# Patient Record
Sex: Male | Born: 1980 | Race: White | Hispanic: No | Marital: Married | Smoking: Current every day smoker
Health system: Southern US, Community
[De-identification: ages and names within clinical notes are randomized; demographics above are authoritative.]

## PROBLEM LIST (undated history)

## (undated) DIAGNOSIS — F319 Bipolar disorder, unspecified: Secondary | ICD-10-CM

## (undated) DIAGNOSIS — N200 Calculus of kidney: Secondary | ICD-10-CM

## (undated) HISTORY — PX: SURGERY SCROTAL / TESTICULAR: SUR1316

## (undated) HISTORY — PX: ABDOMINAL SURGERY: SHX537

---

## 2004-02-14 ENCOUNTER — Emergency Department: Payer: Self-pay | Admitting: Emergency Medicine

## 2004-03-19 ENCOUNTER — Emergency Department: Payer: Self-pay | Admitting: Emergency Medicine

## 2005-09-18 ENCOUNTER — Emergency Department: Payer: Self-pay | Admitting: Emergency Medicine

## 2006-11-15 ENCOUNTER — Emergency Department: Payer: Self-pay | Admitting: Emergency Medicine

## 2006-11-19 ENCOUNTER — Emergency Department: Payer: Self-pay | Admitting: Emergency Medicine

## 2008-11-07 ENCOUNTER — Emergency Department: Payer: Self-pay | Admitting: Emergency Medicine

## 2008-11-08 ENCOUNTER — Emergency Department: Payer: Self-pay | Admitting: Emergency Medicine

## 2011-04-06 ENCOUNTER — Emergency Department: Payer: Self-pay | Admitting: Emergency Medicine

## 2011-04-06 LAB — COMPREHENSIVE METABOLIC PANEL
Albumin: 4.9 g/dL (ref 3.4–5.0)
Anion Gap: 18 — ABNORMAL HIGH (ref 7–16)
BUN: 12 mg/dL (ref 7–18)
Bilirubin,Total: 0.9 mg/dL (ref 0.2–1.0)
Chloride: 101 mmol/L (ref 98–107)
Creatinine: 1.41 mg/dL — ABNORMAL HIGH (ref 0.60–1.30)
EGFR (African American): >60
Glucose: 157 mg/dL — ABNORMAL HIGH (ref 65–99)
Potassium: 3.1 mmol/L — ABNORMAL LOW (ref 3.5–5.1)
SGPT (ALT): 25 U/L
Sodium: 139 mmol/L (ref 136–145)
Total Protein: 8.7 g/dL — ABNORMAL HIGH (ref 6.4–8.2)

## 2011-04-06 LAB — DRUG SCREEN, URINE
Barbiturates, Ur Screen: NEGATIVE (ref ?–200)
Benzodiazepine, Ur Scrn: POSITIVE (ref ?–200)
Cannabinoid 50 Ng, Ur ~~LOC~~: POSITIVE (ref ?–50)
Cocaine Metabolite,Ur ~~LOC~~: NEGATIVE (ref ?–300)
Phencyclidine (PCP) Ur S: NEGATIVE (ref ?–25)

## 2011-04-06 LAB — CBC
HGB: 16.3 g/dL (ref 13.0–18.0)
MCV: 90 fL (ref 80–100)
Platelet: 310 10*3/uL (ref 150–440)
RBC: 5.31 10*6/uL (ref 4.40–5.90)
RDW: 13.4 % (ref 11.5–14.5)
WBC: 14.3 10*3/uL — ABNORMAL HIGH (ref 3.8–10.6)

## 2011-04-06 LAB — URINALYSIS, COMPLETE
Bacteria: NONE SEEN
Bilirubin,UR: NEGATIVE
Ph: 5 (ref 4.5–8.0)
RBC,UR: 5 /HPF (ref 0–5)
Specific Gravity: 1.026 (ref 1.003–1.030)
Squamous Epithelial: 1
WBC UR: 5 /HPF (ref 0–5)

## 2011-04-06 LAB — ETHANOL
Ethanol %: <0.003 % (ref 0.000–0.080)
Ethanol: <3 mg/dL

## 2011-04-20 ENCOUNTER — Emergency Department: Payer: Self-pay | Admitting: Emergency Medicine

## 2011-04-23 ENCOUNTER — Emergency Department: Payer: Self-pay | Admitting: *Deleted

## 2011-04-25 LAB — CBC
MCH: 30.4 pg (ref 26.0–34.0)
MCV: 89 fL (ref 80–100)
Platelet: 372 10*3/uL (ref 150–440)
RBC: 5.24 10*6/uL (ref 4.40–5.90)
RDW: 12.9 % (ref 11.5–14.5)
WBC: 7.7 10*3/uL (ref 3.8–10.6)

## 2011-04-25 LAB — DRUG SCREEN, URINE
Amphetamines, Ur Screen: NEGATIVE
Barbiturates, Ur Screen: NEGATIVE
Benzodiazepine, Ur Scrn: NEGATIVE
Cannabinoid 50 Ng, Ur ~~LOC~~: POSITIVE
Cocaine Metabolite,Ur ~~LOC~~: NEGATIVE
MDMA (Ecstasy)Ur Screen: NEGATIVE
Methadone, Ur Screen: NEGATIVE
Opiate, Ur Screen: NEGATIVE
Phencyclidine (PCP) Ur S: NEGATIVE
Tricyclic, Ur Screen: NEGATIVE

## 2011-04-25 LAB — COMPREHENSIVE METABOLIC PANEL
Albumin: 4.5 g/dL (ref 3.4–5.0)
Anion Gap: 11 (ref 7–16)
Bilirubin,Total: 0.3 mg/dL (ref 0.2–1.0)
Calcium, Total: 9.4 mg/dL (ref 8.5–10.1)
Chloride: 106 mmol/L (ref 98–107)
Co2: 25 mmol/L (ref 21–32)
Creatinine: 0.86 mg/dL (ref 0.60–1.30)
EGFR (African American): >60
Potassium: 3.7 mmol/L (ref 3.5–5.1)
SGOT(AST): 24 U/L (ref 15–37)
Sodium: 142 mmol/L (ref 136–145)
Total Protein: 8.1 g/dL (ref 6.4–8.2)

## 2011-04-25 LAB — ETHANOL
Ethanol %: <0.003 % (ref 0.000–0.080)
Ethanol: <3 mg/dL

## 2011-04-25 LAB — CARBAMAZEPINE LEVEL, TOTAL: Carbamazepine: 10.2 ug/mL

## 2011-04-25 LAB — TSH: Thyroid Stimulating Horm: 1.03 u[IU]/mL

## 2011-04-25 LAB — SALICYLATE LEVEL: Salicylates, Serum: 3 mg/dL — ABNORMAL HIGH

## 2011-04-26 ENCOUNTER — Inpatient Hospital Stay: Payer: Self-pay | Admitting: Psychiatry

## 2011-04-27 LAB — LIPID PANEL
Cholesterol: 265 mg/dL — ABNORMAL HIGH (ref 0–200)
Ldl Cholesterol, Calc: 169 mg/dL — ABNORMAL HIGH (ref 0–100)
Triglycerides: 287 mg/dL — ABNORMAL HIGH (ref 0–200)

## 2011-04-29 LAB — COMPREHENSIVE METABOLIC PANEL
Albumin: 3.7 g/dL (ref 3.4–5.0)
Alkaline Phosphatase: 62 U/L (ref 50–136)
Anion Gap: 8 (ref 7–16)
Calcium, Total: 9.2 mg/dL (ref 8.5–10.1)
Chloride: 103 mmol/L (ref 98–107)
EGFR (African American): >60
EGFR (Non-African Amer.): >60
Glucose: 95 mg/dL (ref 65–99)
Potassium: 4.3 mmol/L (ref 3.5–5.1)
SGOT(AST): 21 U/L (ref 15–37)
SGPT (ALT): 35 U/L
Sodium: 142 mmol/L (ref 136–145)

## 2011-12-27 ENCOUNTER — Emergency Department: Payer: Self-pay | Admitting: Emergency Medicine

## 2011-12-27 LAB — COMPREHENSIVE METABOLIC PANEL
Albumin: 4.6 g/dL (ref 3.4–5.0)
Anion Gap: 7 (ref 7–16)
BUN: 11 mg/dL (ref 7–18)
Calcium, Total: 9.6 mg/dL (ref 8.5–10.1)
Chloride: 103 mmol/L (ref 98–107)
EGFR (African American): >60
EGFR (Non-African Amer.): >60
Glucose: 90 mg/dL (ref 65–99)
Potassium: 3.8 mmol/L (ref 3.5–5.1)
SGOT(AST): 18 U/L (ref 15–37)
SGPT (ALT): 26 U/L (ref 12–78)

## 2011-12-27 LAB — CBC
HCT: 48 % (ref 40.0–52.0)
HGB: 17 g/dL (ref 13.0–18.0)
MCH: 32.3 pg (ref 26.0–34.0)
MCV: 91 fL (ref 80–100)
RBC: 5.27 10*6/uL (ref 4.40–5.90)

## 2012-03-12 LAB — COMPREHENSIVE METABOLIC PANEL
Albumin: 4.6 g/dL (ref 3.4–5.0)
BUN: 11 mg/dL (ref 7–18)
Bilirubin,Total: 0.4 mg/dL (ref 0.2–1.0)
Chloride: 105 mmol/L (ref 98–107)
EGFR (African American): >60
EGFR (Non-African Amer.): >60
Glucose: 95 mg/dL (ref 65–99)
Osmolality: 275 (ref 275–301)
SGOT(AST): 20 U/L (ref 15–37)
SGPT (ALT): 23 U/L (ref 12–78)

## 2012-03-12 LAB — CBC
HCT: 47.9 % (ref 40.0–52.0)
HGB: 15.9 g/dL (ref 13.0–18.0)
MCHC: 33.1 g/dL (ref 32.0–36.0)
MCV: 88 fL (ref 80–100)
Platelet: 374 10*3/uL (ref 150–440)
RBC: 5.46 10*6/uL (ref 4.40–5.90)
WBC: 12.1 10*3/uL — ABNORMAL HIGH (ref 3.8–10.6)

## 2012-03-12 LAB — URINALYSIS, COMPLETE
Bacteria: NONE SEEN
Blood: NEGATIVE
Hyaline Cast: 12
Leukocyte Esterase: NEGATIVE
Nitrite: NEGATIVE
Ph: 7 (ref 4.5–8.0)
Protein: 30
Specific Gravity: 1.024 (ref 1.003–1.030)
WBC UR: 4 /HPF (ref 0–5)

## 2012-03-12 LAB — DRUG SCREEN, URINE
Amphetamines, Ur Screen: NEGATIVE (ref ?–1000)
Barbiturates, Ur Screen: NEGATIVE (ref ?–200)
Benzodiazepine, Ur Scrn: POSITIVE (ref ?–200)
Cannabinoid 50 Ng, Ur ~~LOC~~: POSITIVE (ref ?–50)
MDMA (Ecstasy)Ur Screen: NEGATIVE (ref ?–500)
Methadone, Ur Screen: NEGATIVE (ref ?–300)
Opiate, Ur Screen: NEGATIVE (ref ?–300)
Phencyclidine (PCP) Ur S: NEGATIVE (ref ?–25)

## 2012-03-12 LAB — ACETAMINOPHEN LEVEL: Acetaminophen: <2 ug/mL

## 2012-03-12 LAB — SALICYLATE LEVEL: Salicylates, Serum: 4.1 mg/dL — ABNORMAL HIGH

## 2012-03-12 LAB — ETHANOL
Ethanol %: <0.003 % (ref 0.000–0.080)
Ethanol: <3 mg/dL

## 2012-03-13 ENCOUNTER — Inpatient Hospital Stay: Payer: Self-pay | Admitting: Psychiatry

## 2012-10-02 ENCOUNTER — Emergency Department: Payer: Self-pay | Admitting: Emergency Medicine

## 2014-07-20 NOTE — Consult Note (Signed)
Psychological Assessment  Jaime Shannon Evaluation: 12-13-13Administered: [facility] Multiphasic Personality Inventory-2 (MMPI-2) for Referral: Jaime Shannon was referred for a psychological assessment by his physician, Jaime LineaJolanta Pucilowska, MD.  He was admitted to Behavioral Medicine for the treatment of suicidal thoughts and increased anxiety. He has a history of a bipolar disorder diagnosis.  Please see the history and physical and psychosocial history for further background information. An assessment of personality structure was requested. Jaime Shannon?s MMPI-2 protocol is compared to that of other adult males he obtained the following profile: 6!!7!**812*34"90*+5-/:#. The MMPI-2 validity scales indicate that the clinical profile is probably not valid. His validity scale elevations suggest the possibility of conscious distortion and exaggeration of symptoms for secondary gain or panicked over-endorsement possibly to draw attention to current fear, panic and distress. Impression:of Bipolar DisorderPersonality Disorder                            Electronic Signatures: Carola Frostoush, Lee Ann (PsyD, HSP-P)  (Signed on 18-Dec-13 11:50)  Authored  Last Updated: 18-Dec-13 11:50 by Carola Frostoush, Lee Ann (PsyD, HSP-P)

## 2014-07-20 NOTE — Consult Note (Signed)
PATIENT NAME:  Jaime Shannon, Aviyon P MR#:  [id number] DATE OF BIRTH:  11/30/1980  DATE OF CONSULTATION:  03/13/2012  REFERRING PHYSICIAN:   CONSULTING PHYSICIAN:  Zahra Peffley K. Darleene Cumpian, MD  SUBJECTIVE: Patient is a 34 year old single white male who is employed as a Investment banker, operationalchef in [place]. Patient is single and lives with his parents who are in their 8050s. Patient has a long history of bipolar disorder and comes back to the hospital stating "I am anxious. I need help. I am not doing well."  HISTORY OF PRESENT ILLNESS: According to information obtained from the chart patient has been off of his medications for five months and reported has been having nausea and vomiting every morning and has been sick for weeks and he has rocking movements and had been back and forth and crying and talking about suicide and suicidal thoughts.   PAST PSYCHIATRIC HISTORY: Patient has a long history of bipolar disorder and he had been on following medications: Depakote 500 mg three times a day, hydroxyzine 25 mg daily, trazodone 15 mg at bedtime. Patient reports that he has been noncompliant with medications for five months. Did have suicidal thoughts in the past but not now as they are under control. Did have inpatient hospitalization in psychiatry in the past.   ALCOHOL AND DRUGS: Denies drinking alcohol. Does admit smoking THC at the rate of 3 grams per day for many years, probably five years, and last smoked it three days ago. When asked how he got the money he reported "I make good money".   MENTAL STATUS EXAM: Patient is alert and oriented to place, person, time. Affect is appropriate with his mood which is low and down and depressed. Admits to crying spells and suicidal thoughts as the cause of feeling low and down but contracts for safety now. Denies any homicidal ideas. Admits feeling paranoid and suspicious and states that for a long time. Denies hearing voices, seeing things. Memory is intact. Cognition intact.  Denies any ideas or plans and said "not now". Does admit feeling anxious and upset and fed up of life. Insight and judgement guarded.    IMPRESSION:  1. Bipolar disorder, depressed. 2. THC dependence, chronic, continuous.   RECOMMENDATION: Patient is started back on above named medications on which he has been stabilized in the past. Inpatient hospitalization in psychiatry when bed is available.    ____________________________ Jannet MantisSurya K. Guss Bundehalla, MD skc:cms D: 03/13/2012 09:08:58 ET T: 03/13/2012 09:19:41 ET JOB#: [id number]  cc: Monika SalkSurya K. Guss Bundehalla, MD, <Dictator>  Beau FannySURYA K Cooper Moroney MD ELECTRONICALLY SIGNED 03/15/2012 17:45

## 2014-07-25 NOTE — Consult Note (Signed)
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   Patient was seen by consultant.   Recommend further assessment or treatment.   Orders entered.   Discussed with Attending MD.   Comments: Mr. Jaime Shannon is a 34 y/o Caucasian male with a prior diagnosis of Bipolar Disorder and Cannabis Dependence who came to the ER voluntarily endorsing both suicidal thoughts and homicidal thoughts in the context of having had recent altercation with his stepfather. The patient also reports that he has been clean from the marijuana which he used daily for years for the past 23 days. He was started on Tegretol by Dr. Luther HearingMisckevic 5 days ago and Tegretol level is 10.2, therapeutic, but he is still having problems with anxiety and racing thoughts. He does endorse generalized homicial thoughts and feels like he could hurt "anyone", no specicific plan. He has been having suicidal thoughts for the past 2-3 days, no plan. Will plan to admit to Inpatient Pscychiatry for medication management, safety,  and stabilization. Patient has an immediate family member [facility] so will refer to another community hospital for admission. Will go ahead and start Depakote at 500mg  po BID for mood stabilization and will give 1000mg  po loading dose of Depakote now.  Electronic Signatures: Caryn SectionKapur, Aarti (MD)  (Signed 23-Jan-13 19:13)  Authored: Brief Consult Note   Last Updated: 23-Jan-13 19:13 by Caryn SectionKapur, Aarti (MD)

## 2014-07-25 NOTE — Consult Note (Signed)
PATIENT NAME:  Jaime Shannon, Atley MR#:  [id number] DATE OF BIRTH:  [birth year]  DATE OF CONSULTATION:  04/25/2011  REFERRING PHYSICIAN:  Rockne MenghiniAnne-Caroline Norman, M.D.  CONSULTING PHYSICIAN:  Doralee AlbinoAarti K. Maryruth BunKapur, MD  REASON FOR CONSULTATION: Suicidal and homicidal thoughts.   IDENTIFYING INFORMATION: [name] is a 34 year old divorced Caucasian male currently living in the [place] area with his mother and stepfather as well as his fiancee and 3662-month-old daughter. 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   HISTORY OF PRESENT ILLNESS: [name] is a 34 year old divorced Caucasian male who came voluntarily on his own to the Emergency Room because he "felt like he was going crazy". The patient says that he was feeling like he was going to hurt someone and was extremely on edge and irritable. He says he has been free from marijuana for the past 23 days but prior to that was smoking high-grade marijuana $100 a day for over 15 years. He was endorsing suicidal thoughts for the past 2 to 3 days but denies any specific plan. The patient was also endorsing generalized homicidal ideation. He said if he would hurt anyone he would look up sex offenders or rapist on the internet and try to find them because he felt like he wanted to hurt somebody who deserved to be hurt. He denied any specific plan. The patient was extremely anxious during the interview, pacing in the room and was restless, had a difficult time sitting still. He does endorse a number of recent stressors including conflict with his stepfather prior to coming into the hospital. The patient has been living with his mom and stepfather for the past two years. He denies any problems in the relationship with his fiancee, however. The patient does admit to mixed mood symptoms including problems with irritability, anger outbursts, increased energy with increased goal-directed behavior when he is feeling  somewhat manic. He denies any grandiose delusions, hyperreligious thoughts or hypersexual behavior. He does admit to problems with extreme anxiety and mild paranoid thoughts "his whole life". He said sometimes he fears going back to prison. He has an extensive legal history and has been incarcerated multiple times in the past for armed robbery, safe cracking and cannabis use. He currently endorses feelings of hopelessness and helplessness, decreased focus concentration and insomnia. He denies any change in appetite, weight gain or weight loss. He denies any anhedonia or change in energy level. The patient is willing to seek hospitalization voluntarily. He was started on Tegretol five days ago by Dr. Alver FisherMickiewicz but says the medication makes him feel somewhat sleepy but has not helped with his mood.   PAST PSYCHIATRIC HISTORY: Patient denies any prior inpatient psychiatric hospitalizations. He does have a history of cutting his wrists when in jail in the past. Most recently he was seen by Dr. Alver FisherMickiewicz five days ago and then did an intake with Michaelyn [facility]Steve Sacko at [facility] earlier this morning. He has had a trial of Paxil in the past but says it "made him go crazy". He was started on Tegretol 400 mg at bedtime five days ago. He denies any other psychotropic medication trials in the past.   SUBSTANCE ABUSE HISTORY: The patient reports using marijuana $100 a day for the past 15 years but has been clean for the past 23 days. He tried cocaine and also tried snorting heroin once in prison but denies any regular use of cocaine or opioids. He says he rarely drinks  alcohol, about once a month. He denies any prescription narcotic abuse but has abused benzodiazepines in the past approximately 10 years ago. He does smoke 1-1/2 packs of cigarettes per day currently.   FAMILY PSYCHIATRIC HISTORY: The patient says that his biological father is an alcoholic and drug addict. He says his stepsister with whom he grew up with and  currently lives with is hospitalized on the psychiatry service currently after an overdose.   PAST MEDICAL HISTORY:  1. History of hypertension. 2. History of surgery for testicular torsion. 3. History of broken jaw.  4. He denies any history of any prior TBI or seizures.   OUTPATIENT MEDICATIONS:  1. Tegretol 400 mg p.o. nightly. 2. Ativan 0.5 mg p.o. daily p.r.n. which he just got from the Emergency Room yesterday.   ALLERGIES: No known drug allergies.   SOCIAL HISTORY: Patient was born and raised in the [place] area by both parents until they divorced at the age of 78. He was then raised by his mother and stepfather. He has two brothers from his mother's side and three step siblings. He denies any history of any physical or sexual abuse. Patient has not seen his biological father in 21 years. He has an eighth-grade education. He was expelled for marijuana use in the eighth grade but then later went back and got his GED. He works currently as a Biomedical scientist in [employer] and drives there on a daily basis. He has been commuting there for the past eight months. The patient has worked in restaurants in the past primarily. He currently lives with his mother and stepfather as well as his fiancee and 27-month-old daughter as well as multiple step siblings and their children in the [place] area.   LEGAL HISTORY: Patient has an extensive legal history having been incarcerated multiple times, each time for about 8 to 12 months for armed robbery, safe cracking, marijuana use.   MENTAL STATUS EXAM: [name] is a 34 year old Caucasian male who was somewhat restless and mildly agitated. He was pacing in his room, had a difficult time sitting still. He was fully alert and oriented to time, place, and situation. Had reddish color hair and a slight beard. Speech was regular rate and rhythm, fluent and coherent. Mood was described as being depressed, however, affect was anxious and restless. Thought processes  were somewhat tangential and patient had to be redirected at times. He did endorse suicidal thoughts but denied any specific plan. He did report homicidal thoughts, no specific plan and said he planned to target people that he found on line that were rapists or sex offenders. He denied any current auditory or visual hallucinations. He did admit to some paranoid thoughts feeling like people had been watching him but said that these thoughts were present his whole life. No overt delusions. Attention and concentration were fairly good. Recall was three out of three initially and three out of three after five minutes. He named the prior presidents as Licensed conveyancer. He had a difficult time doing serial sevens but could do simple calculations. He spelled world backwards as "dlow". Abstraction was good.   SUICIDE RISK ASSESSMENT: At this time Mr. Mcbane remains at a moderately elevated risk of harm to self and others secondary to suicidal and homicidal thoughts. He denies having any access to guns. He is willing to participate in treatment, however, and willing to come into the hospital voluntarily.   REVIEW OF SYSTEMS: CONSTITUTIONAL: He denies any weakness, fatigue or weight changes.  He denies any fever, chills, or night sweats. HEENT: He does report headache currently. He denies any change in vision or dizziness. ENT: He denies any hearing loss. RESPIRATORY: He denies any shortness of breath or cough. CARDIOVASCULAR: He denies any chest pain, orthopnea. GASTROINTESTINAL: He denies any nausea, vomiting, or abdominal pain. He denies any change in bowel movements. GENITOURINARY: He denies incontinence or problems with frequency of urine. ENDOCRINE: He denies any heat or cold intolerance. LYMPHATIC: He denies any anemia or easy bruising. MUSCULOSKELETAL: He denies any current muscle or joint pain. NEUROLOGICAL: He does report a headache. No dizziness. He denies any tingling or weakness. No difficulty with his gait.  PSYCHIATRIC: Please see history of present illness.   PHYSICAL EXAMINATION:  VITAL SIGNS: Blood pressure 126/71, pulse 74, respirations 18, temperature 96.2, pulse ox 96% on room air.   LABORATORY, DIAGNOSTIC AND RADIOLOGICAL DATA: Toxicology screen was positive for cannabis but negative for all other substances. CBC, TSH, LFTs and BMP are within normal limits. Glucose 121. Tegretol level 10.2. Acetaminophen less than 2. Salicylates elevated 3. EKG showed a ventricular rate of 103 with a QTc of 424 on 01/18.   DIAGNOSES:  AXIS I:  1. Bipolar disorder type II.  2. Cannabis dependence. 3. History of opiate and cocaine use.  4. Nicotine dependence.   AXIS II: Rule out personality disorder.   AXIS III:  1. Hypertension.  2. History of testicular torsion.  3. History of broken jaw.   AXIS IV: Moderate-Family conflict, comorbid substance use, noncompliance with treatment in the past, history of legal problems.   AXIS V: GAF at present equals 25.   ASSESSMENT AND TREATMENT RECOMMENDATIONS: Mr. Kasparek is a 34 year old divorced Caucasian male with recent diagnosis of bipolar disorder who presented to the Emergency Room with both suicidal and homicidal thoughts. He does appear to be extremely anxious with some mild hypomanic symptoms. He was not able to contract for safety outside of the hospital. Currently he denies any psychotic symptoms. Will admit to inpatient psychiatry for medication management, safety, and stabilization. 1. Bipolar disorder type II. Will plan to start the patient on Depakote 500 mg p.o. daily and 750 mg p.o. nightly as he has not any significant improvement on Tegretol despite Tegretol level being therapeutic in the Emergency Room. Will hold off on starting an antidepressant at this time secondary to mixed manic symptoms. EKG does not show any QTc prolongation. Will check B12 and folate level as well as lipid panel in a.m. Will place on suicide precautions and one-to-one  observation while in the Emergency Room.  2. Disposition. Recommend Mr. Traweek be admitted to inpatient psychiatry for medication management, safety, and stabilization. At this time he cannot be admitted to the [facility] inpatient psychiat[facility] service as he has an immediate family member as stated above who is currently being hospitalized on the unit. If that patient is discharged patient may be able to be admitted to [facility] inpatient psychiatry. Otherwise will hold in the Emergency Room while seeing a bed at a community hospital. The patient verbalized understanding to plan and was in agreement. He has agreed to stay in the Emergency Room voluntarily and would like a voluntary admission rather than being committed, however, if patient does express a desire to leave he should be placed under an involuntary commitment.  ____________________________ Doralee Albino. Maryruth Bun, MD akk:cms D: 04/26/2011 07:47:53 ET T: 04/26/2011 08:15:38 ET JOB#: [id number]  cc: Aarti K. Maryruth Bun, MD, <Dictator> AARTI K  Maryruth Bun MD ELECTRONICALLY SIGNED 04/26/2011 18:22

## 2014-08-10 NOTE — H&P (Signed)
PATIENT NAMMarland Shannon:  Rocky LinkJERNIGAN, Nizar P [id number] OF BIRTH:  November 03, 1980 OF ADMISSION:  03/13/2012 PHYSICIAN:  Daryel NovemberJonathan Williams, MDPHYSICIAN:  Jannet MantisSurya K. Guss Bundehalla, MD ATTENDING PHYSICIAN: Kristine LineaJolanta Reyann Troop, MD DATA: The patient is a 34 year old male with a history of anxiety, mood instability and substance abuse, COMPLAINT: "I am anxious. I need help. I am not doing well." OF PRESENT ILLNESS: The patient has been off of his medications for five months. He has been increasingly depressed, emotionaly unstable, irritable and angry. He recently developed suicidal ideation. He suffers severe anxiety with daily panic attacks and vomiting.   He reports poor sleep, decreased appetite with weight loss, crying, feeling of guilt, worthlessness, helplessness, low energy, poor memory and concentration and social isolation. He has been smoking cannabis daily. He denies alcohol or other drug use. He has been taking Valium of the streets for incoming panic attacks. He denies psychotic symptoms or symptoms suggestive of bipolar mania.  PSYCHIATRIC HISTORY: Patient has a long history of bipolar disorder. He was hospitalized ar [facility] in January 2013 for similar symptoms. There were no suicide attampts. He has not been compliant with the following medications: Depakote 500 mg three times a day, hydroxyzine 25 mg daily, trazodone 150 mg at bedtime. He was tried on Tegretol and Paxil in the past. PSYCHIATRIC HISTORY:  His father is an alcoholic and drug addict. MEDICAL HISTORY: Hypertension.  Phenergan. ON ADMISSION: None.  HISTORY: He lives with his mother and father and [place]. He works as a Biomedical scientistsous chef. He has a 34-year-old daughter but has not been living with his girlfriend due to fears that the child would be taken away by CPS as the patient is drug abuser. He was incarcerated multiple times for robbery. No current legal problems.  He denies drinking alcohol. He smokes at the rate of 3 grams per day for 15  years.  OF SYSTEMS: CONSTITUTIONAL: No fevers or chills. Positive for weight loss. EYES: No double or blurred vision. ENT: No hearing loss. RESPIRATORY: No shortness of breath or cough. CARDIOVASCULAR: No chest pain or orthopnea. GASTROINTESTINAL: No abdominal pain. Positive for daily vomiting. GU: No incontinence or frequency. ENDOCRINE: No heat or cold intolerance. LYMPHATIC: No anemia or easy bruising. INTEGUMENTARY: No acne or rash. MUSCULOSKELETAL: Positive for muscle and joint pain. NEUROLOGIC: No tingling or weakness. PSYCHIATRIC: See history of present illness for details.  EXAMINATION: VITAL SIGNS: Blood pressure 148/99, pulse 104, respirations 18, temperature 97.2. GENERAL: This is a well developed male in no acute distress. HEENT: The pupils are equal, round, and reactive to light. Sclerae are anicteric. NECK: Supple. No thyromegaly. LUNGS: Clear to auscultation. No dullness to percussion. HEART: Regular rhythm and rate. No murmurs, rubs, or gallops. ABDOMEN: Soft, nontender, nondistended. Positive bowel sounds. MUSCULOSKELETAL: Normal muscle strength in all extremities. SKIN: No rashes or bruises. LYMPHATIC: No cervical adenopathy. NEUROLOGIC: Cranial nerves II through XII are intact.  DATA: Chemistries are within normal limits. Blood alcohol level is zero. LFTs are within normal limits. TSH 0.75. Urine tox screen positive for benzodiazepines and MJ. CBC within normal limits except WBC 12.1. Urinalysis is not suggestive of urinary tract infection. Serum acetaminophen and salicylates are low.   STATUS EXAMINATION ON ADMISSION: The patient is alert and oriented to person, place, time, and situation. He is slightly irritable. He is wearing hospital scrubs. He maintains good eye contact. His speech is of normal rhythm, rate, and volume. Mood is depressed with flat affect. Thought processing is logical and goal oriented. Thought  content: he denies suicidal ideation but was admitted for suicidal thoughts.  There are no delusions or paranoia. There are no auditory or visual hallucinations. His cognition is grossly intact. He registers three out of three and recalls three out of three objects after five minutes. He can spell world forward and backward. He knows the current president. His insight and judgment are questionable.    SUICIDE RISK ASSESSMENT ON ADMISSION: This is a patient with a lifelong history of substance abuse, anxiety and mood instability who came to the ER suicidal.   DIAGNOSES: I:  Mood disorder, not otherwise specified.  Anxiety disorder, not otherwise specified.  AXIS II:  Deferred. III:  Deferred.  IV:  Mental illness, subsatnce abuse, treatment compliance, relationship, family conflict.  V:  GAF on admission 25.  The patient was [facility]al Medicine Unit for safety, stabilization and medication management. She was initially placed on suicide precautions and was closely monitored for any unsafe behaviors. She underwent full psychiatric and risk assessment. She received pharmacotherapy, individual and group psychotherapy, substance abuse counseling, and support from therapeutic milieu.  Suicidal ideation: The patient is able to contarct for safety.  Mood: We will start on Tegretol for mood stabilization and Remeron for depression.   Substance abuse: He minimizes problems and declines treatment.   Disposition: TBE.    Electronic Signatures: Kristine LineaPucilowska, Tonya Carlile (MD)  (Signed on 20-Dec-13 07:00)  Authored  Last Updated: 20-Dec-13 07:00 by Kristine LineaPucilowska, Erika Hussar (MD)

## 2014-08-10 NOTE — H&P (Signed)
PATIENT NAME:  Jaime Shannon, Winner [id number] OF BIRTH:  27-Jun-1980 OF AMDISSION:  04/26/2011 PHYSICIAN:  Rockne MenghiniAnne-Caroline Norman, M.D. PHYSICIAN:  Aarti K. Maryruth BunKapur, MD  REASON FOR ADMITTING: Suicidal and homicidal thoughts.  INFORMATION: [name] is a 34 year old divorced Caucasian male currently living in the [place] area with his mother and stepfather as well as his fiancee and 2548-month-old daughter. 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  OF PRESENT ILLNESS: [name] is a 34 year old divorced Caucasian male who came voluntarily on his own to the Emergency Room because he "felt like he was going crazy". The patient says that he was feeling like he was going to hurt someone and was extremely on edge and irritable. He says he has been free from marijuana for the past 23 days but prior to that was smoking high-grade marijuana $100 a day for over 15 years. He was endorsing suicidal thoughts for the past 2 to 3 days but denies any specific plan. The patient was also endorsing generalized homicidal ideation. He said if he would hurt anyone he would look up sex offenders or rapist on the internet and try to find them because he felt like he wanted to hurt somebody who deserved to be hurt. He denied any specific plan. The patient was extremely anxious during the interview, pacing in the room and was restless, had a difficult time sitting still. He does endorse a number of recent stressors including conflict with his stepfather prior to coming into the hospital. The patient has been living with his mom and stepfather for the past two years. He denies any problems in the relationship with his fiancee, however. The patient does admit to mixed mood symptoms including problems with irritability, anger outbursts, increased energy with increased goal-directed behavior when he is feeling somewhat manic. He denies any grandiose delusions, hyperreligious thoughts or  hypersexual behavior. He does admit to problems with extreme anxiety and mild paranoid thoughts "his whole life". He said sometimes he fears going back to prison. He has an extensive legal history and has been incarcerated multiple times in the past for armed robbery, safe cracking and cannabis use. He currently endorses feelings of hopelessness and helplessness, decreased focus concentration and insomnia. He denies any change in appetite, weight gain or weight loss. He denies any anhedonia or change in energy level. The patient is willing to seek hospitalization voluntarily. He was started on Tegretol five days ago by Dr. Alver FisherMickiewicz but says the medication makes him feel somewhat sleepy but has not helped with his mood.  PSYCHIATRIC HISTORY: Patient denies any prior inpatient psychiatric hospitalizations. He does have a history of cutting his wrists when in jail in the past. Most recently he was seen by Dr. Alver FisherMickiewicz five days ago and then did an intake with Michaelyn BarterSteve Sacko at [facility] earlier this morning. He has had a trial of Paxil in the past but says it "made him go crazy". He was started on Tegretol 400 mg at bedtime five days ago. He denies any other psychotropic medication trials in the past.  ABUSE HISTORY: The patient reports using marijuana $100 a day for the past 15 years but has been clean for the past 23 days. He tried cocaine and also tried snorting heroin once in prison but denies any regular use of cocaine or opioids. He says he rarely drinks alcohol, about once a month. He denies any prescription narcotic abuse but has abused benzodiazepines in the past  approximately 10 years ago. He does smoke 1-1/2 packs of cigarettes per day currently.  PSYCHIATRIC HISTORY: The patient says that his biological father is an alcoholic and drug addict. He says his stepsister with whom he grew up with and currently lives with is hospitalized on the psychiatry service currently after an overdose.  MEDICAL HISTORY:   1. History of hypertension. 2. History of surgery for testicular torsion. History of broken jaw.  He denies any history of any prior TBI or seizures.  MEDICATIONS:  1. Tegretol 400 mg p.o. nightly. 2. Ativan 0.5 mg p.o. daily p.r.n. which he just got from the Emergency Room yesterday.   ALLERGIES: No known drug allergies.  HISTORY: Patient was born and raised in the [place] area by both parents until they divorced at the age of 89. He was then raised by his mother and stepfather. He has two brothers from his mother?s side and three step siblings. He denies any history of any physical or sexual abuse. Patient has not seen his biological father in 21 years. He has an eighth-grade education. He was expelled for marijuana use in the eighth grade but then later went back and got his GED. 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 He has been commuting there for the past eight months. The patient has worked in restaurants in the past primarily. He currently lives with his mother and stepfather as well as his fiancee and 853-month-old daughter as well as multiple step siblings and their children in the [place] area.  HISTORY: Patient has an extensive legal history having been incarcerated multiple times, each time for about 8 to 12 months for armed robbery, safe cracking, marijuana use.  STATUS EXAM: [name] is a 34 year old Caucasian male who was somewhat restless and mildly agitated. He was pacing in his room, had a difficult time sitting still. He was fully alert and oriented to time, place, and situation. Had reddish color hair and a slight beard. Speech was regular rate and rhythm, fluent and coherent. Mood was described as being depressed, however, affect was anxious and restless. Thought processes were somewhat tangential and patient had to be redirected at times. He did endorse suicidal thoughts but denied any specific plan. He did report homicidal thoughts, no  specific plan and said he planned to target people that he found on line that were rapists or sex offenders. He denied any current auditory or visual hallucinations. He did admit to some paranoid thoughts feeling like people had been watching him but said that these thoughts were present his whole life. No overt delusions. Attention and concentration were fairly good. Recall was three out of three initially and three out of three after five minutes. He named the prior presidents as Licensed conveyancerbama and Bush. He had a difficult time doing serial sevens but could do simple calculations. He spelled world backwards as "dlow". Abstraction was good.  RISK ASSESSMENT: At this time [name] remains at a moderately elevated risk of harm to self and others secondary to suicidal and homicidal thoughts. He denies having any access to guns. He is willing to participate in treatment, however, and willing to come into the hospital voluntarily.  OF SYSTEMS: CONSTITUTIONAL: He denies any weakness, fatigue or weight changes. He denies any fever, chills, or night sweats. HEENT: He does report headache currently. He denies any change in vision or dizziness. ENT: He denies any hearing loss. RESPIRATORY: He denies any shortness of breath or  cough. CARDIOVASCULAR: He denies any chest pain, orthopnea. GASTROINTESTINAL: He denies any nausea, vomiting, or abdominal pain. He denies any change in bowel movements. GENITOURINARY: He denies incontinence or problems with frequency of urine. ENDOCRINE: He denies any heat or cold intolerance. LYMPHATIC: He denies any anemia or easy bruising. MUSCULOSKELETAL: He denies any current muscle or joint pain. NEUROLOGICAL: He does report a headache. No dizziness. He denies any tingling or weakness. No difficulty with his gait. PSYCHIATRIC: Please see history of present illness.  EXAMINATION: SIGNS: Blood pressure 127/76, pulse 78, respirations 18, temperature 96.8, pulse ox 96% on room air.  Normocephalic,  atraumatic. Pupils equal, round, and reactive to light and accommodation. Extraocular movements intact. Oral mucosa moist.  Supple. No cervical lymphadenopathy or thyromegaly present.  Clear to auscultation bilaterally. No crackles, rales, or rhonchi.  S1, S2, present. Regular rate and rhythm. No murmurs, rubs, or gallops.  Soft. Normoactive bowel sounds present in all four quadrants. No tenderness noted, no masses noted  +2 pedal pulses bilaterally. No rashes or cyanosis.  Cranial nerves 2 through 12 are grossly intact. DIAGNOSTIC AND RADIOLOGICAL DATA: Toxicology screen was positive for cannabis but negative for all other substances. CBC, TSH, LFTs and BMP are within normal limits. Glucose 121. Tegretol level 10.2. Acetaminophen less than 2. Salicylates elevated 3. EKG showed a ventricular rate of 103 with a QTc of 424 on 01/18.  I:  1. Bipolar disorder type II.  2. Cannabis dependence. History of opiate and cocaine use.  Nicotine dependence.  II: Rule out personality disorder.  III:  1. Hypertension.  2. History of testicular torsion.  History of broken jaw.  IV: Moderate-Family conflict, comorbid substance use, noncompliance with treatment in the past, history of legal problems.  V: GAF at present equals 25.  AND TREATMENT RECOMMENDATIONS: Mr. Ausborn is a 34 year old divorced Caucasian male with recent diagnosis of bipolar disorder who presented to the Emergency Room with both suicidal and homicidal thoughts. He does appear to be extremely anxious with some mild hypomanic symptoms. He was not able to contract for safety outside of the hospital. Currently he denies any psychotic symptoms. Will admit to inpatient psychiatry for medication management, safety, and stabilization. 1. Bipolar disorder type II. He was started on Depakote 500 mg p.o. daily and 750 mg p.o. nightly as he has not any significant improvement on Tegretol despite Tegretol level being therapeutic in the Emergency Room. Will  hold off on starting an antidepressant at this time secondary to mixed manic symptoms. EKG does not show any QTc prolongation. Will check B12 and folate level as well as lipid panel in a.m. Will place on suicide precautions and one-to-one observation while in the Emergency Room.  Disposition. To be determined; Patient does not want to return to prior living situation. Mental health followup will be with [facility]   Electronic Signatures: Caryn Section (MD)  (Signed on 24-Jan-13 21:15)  Authored  Last Updated: 24-Jan-13 21:15 by Caryn Section (MD)

## 2014-10-13 ENCOUNTER — Emergency Department: Payer: Self-pay

## 2014-10-13 ENCOUNTER — Emergency Department
Admission: EM | Admit: 2014-10-13 | Discharge: 2014-10-13 | Disposition: A | Discharge status: Home / Self Care | Payer: Self-pay | Attending: Emergency Medicine | Admitting: Emergency Medicine

## 2014-10-13 ENCOUNTER — Encounter: Payer: Self-pay | Admitting: Emergency Medicine

## 2014-10-13 DIAGNOSIS — K580 Irritable bowel syndrome with diarrhea: Secondary | ICD-10-CM | POA: Insufficient documentation

## 2014-10-13 DIAGNOSIS — Z9889 Other specified postprocedural states: Secondary | ICD-10-CM | POA: Insufficient documentation

## 2014-10-13 DIAGNOSIS — R1032 Left lower quadrant pain: Secondary | ICD-10-CM

## 2014-10-13 DIAGNOSIS — Z720 Tobacco use: Secondary | ICD-10-CM | POA: Insufficient documentation

## 2014-10-13 DIAGNOSIS — K589 Irritable bowel syndrome without diarrhea: Secondary | ICD-10-CM

## 2014-10-13 LAB — CBC
HEMATOCRIT: 45.8 % (ref 40.0–52.0)
Hemoglobin: 15.3 g/dL (ref 13.0–18.0)
MCH: 29.9 pg (ref 26.0–34.0)
MCHC: 33.4 g/dL (ref 32.0–36.0)
MCV: 89.4 fL (ref 80.0–100.0)
Platelets: 354 10*3/uL (ref 150–440)
RBC: 5.13 MIL/uL (ref 4.40–5.90)
RDW: 14.1 % (ref 11.5–14.5)
WBC: 9.7 10*3/uL (ref 3.8–10.6)

## 2014-10-13 LAB — COMPREHENSIVE METABOLIC PANEL
ALK PHOS: 63 U/L (ref 38–126)
ALT: 21 U/L (ref 17–63)
AST: 20 U/L (ref 15–41)
Albumin: 4.7 g/dL (ref 3.5–5.0)
Anion gap: 6 (ref 5–15)
BUN: 15 mg/dL (ref 6–20)
CALCIUM: 9.7 mg/dL (ref 8.9–10.3)
CO2: 28 mmol/L (ref 22–32)
Chloride: 107 mmol/L (ref 101–111)
Creatinine, Ser: 0.9 mg/dL (ref 0.61–1.24)
GFR calc Af Amer: >60 mL/min (ref >60)
GFR calc non Af Amer: >60 mL/min (ref >60)
Glucose, Bld: 102 mg/dL — ABNORMAL HIGH (ref 65–99)
Potassium: 4.1 mmol/L (ref 3.5–5.1)
SODIUM: 141 mmol/L (ref 135–145)
Total Bilirubin: 0.5 mg/dL (ref 0.3–1.2)
Total Protein: 7.8 g/dL (ref 6.5–8.1)

## 2014-10-13 LAB — URINALYSIS COMPLETE WITH MICROSCOPIC (ARMC ONLY)
Bacteria, UA: NONE SEEN
Bilirubin Urine: NEGATIVE
Glucose, UA: NEGATIVE mg/dL
Hgb urine dipstick: NEGATIVE
Ketones, ur: NEGATIVE mg/dL
Leukocytes, UA: NEGATIVE
Nitrite: NEGATIVE
PH: 5 (ref 5.0–8.0)
Protein, ur: NEGATIVE mg/dL
SPECIFIC GRAVITY, URINE: 1.029 (ref 1.005–1.030)
Squamous Epithelial / LPF: NONE SEEN

## 2014-10-13 LAB — LIPASE, BLOOD: Lipase: 51 U/L (ref 22–51)

## 2014-10-13 MED ORDER — IOHEXOL 240 MG/ML SOLN
25.0000 mL | Freq: Once | INTRAMUSCULAR | Status: AC | PRN
  Administered 2014-10-13: 25 mL via INTRAVENOUS
  Filled 2014-10-13: qty 25

## 2014-10-13 MED ORDER — MORPHINE SULFATE 4 MG/ML IJ SOLN
INTRAMUSCULAR | Status: AC
  Administered 2014-10-13: 4 mg via INTRAVENOUS
  Filled 2014-10-13: qty 1

## 2014-10-13 MED ORDER — DICYCLOMINE HCL 20 MG PO TABS: 9.0000 mg | ORAL_TABLET | Freq: Three times a day (TID) | ORAL | Status: DC | PRN

## 2014-10-13 MED ORDER — MORPHINE SULFATE 4 MG/ML IJ SOLN
4.0000 mg | Freq: Once | INTRAMUSCULAR | Status: AC
Start: 2014-10-13 — End: 2014-10-13
  Administered 2014-10-13: 4 mg via INTRAVENOUS

## 2014-10-13 MED ORDER — ONDANSETRON HCL 4 MG PO TABS: 4.0000 mg | ORAL_TABLET | Freq: Every day | ORAL | Status: DC | PRN

## 2014-10-13 MED ORDER — ONDANSETRON HCL 4 MG/2ML IJ SOLN
4.0000 mg | Freq: Once | INTRAMUSCULAR | Status: AC
  Administered 2014-10-13: 4 mg via INTRAVENOUS

## 2014-10-13 MED ORDER — IOHEXOL 300 MG/ML  SOLN
100.0000 mL | Freq: Once | INTRAMUSCULAR | Status: AC | PRN
  Administered 2014-10-13: 100 mL via INTRAVENOUS
  Filled 2014-10-13: qty 100

## 2014-10-13 MED ORDER — SODIUM CHLORIDE 0.9 % IV SOLN
Freq: Once | INTRAVENOUS | Status: AC
  Administered 2014-10-13: 15:00:00 via INTRAVENOUS

## 2014-10-13 MED ORDER — ONDANSETRON HCL 4 MG/2ML IJ SOLN
INTRAMUSCULAR | Status: AC
  Administered 2014-10-13: 4 mg via INTRAVENOUS
  Filled 2014-10-13: qty 2

## 2014-10-13 NOTE — ED Notes (Signed)
Pt with acute onset abd pain started today at work, sharp pain in nature. Pt with hx of kidney stones. Pt denies n/v but has had some diarrhea.

## 2014-10-13 NOTE — Discharge Instructions (Signed)
Abdominal Pain Many things can cause belly (abdominal) pain. Most times, the belly pain is not dangerous. Many cases of belly pain can be watched and treated at home. HOME CARE   Do not take medicines that help you go poop (laxatives) unless told to by your doctor.  Only take medicine as told by your doctor.  Eat or drink as told by your doctor. Your doctor will tell you if you should be on a special diet. GET HELP IF:  You do not know what is causing your belly pain.  You have belly pain while you are sick to your stomach (nauseous) or have runny poop (diarrhea).  You have pain while you pee or poop.  Your belly pain wakes you up at night.  You have belly pain that gets worse or better when you eat.  You have belly pain that gets worse when you eat fatty foods.  You have a fever. GET HELP RIGHT AWAY IF:   The pain does not go away within 2 hours.  You keep throwing up (vomiting).  The pain changes and is only in the right or left part of the belly.  You have bloody or tarry looking poop. MAKE SURE YOU:   Understand these instructions.  Will watch your condition.  Will get help right away if you are not doing well or get worse. Document Released: 09/05/2007 Document Revised: 03/24/2013 Document Reviewed: 11/26/2012 [facility] Patient Information 2015 [place], [place]. This information is not intended to replace advice given to you by your health care provider. Make sure you discuss any questions you have with your health care provider.  Diet and Irritable Bowel Syndrome  No cure has been found for irritable bowel syndrome (IBS). Many options are available to treat the symptoms. Your caregiver will give you the best treatments available for your symptoms. He or she will also encourage you to manage stress and to make changes to your diet. You need to work with your caregiver and Registered Dietician to find the best combination of medicine, diet, counseling, and support to  control your symptoms. The following are some diet suggestions. FOODS THAT MAKE IBS WORSE  Fatty foods, such as [place] fries.  Milk products, such as cheese or ice cream.  Chocolate.  Alcohol.  Caffeine (found in coffee and some sodas).  Carbonated drinks, such as soda. If certain foods cause symptoms, you should eat less of them or stop eating them. FOOD JOURNAL   Keep a journal of the foods that seem to cause distress. Write down:  What you are eating during the day and when.  What problems you are having after eating.  When the symptoms occur in relation to your meals.  What foods always make you feel badly.  Take your notes with you to your caregiver to see if you should stop eating certain foods. FOODS THAT MAKE IBS BETTER Fiber reduces IBS symptoms, especially constipation, because it makes stools soft, bulky, and easier to pass. Fiber is found in bran, bread, cereal, beans, fruit, and vegetables. Examples of foods with fiber include:  Apples.  Peaches.  Pears.  Berries.  Figs.  Broccoli, raw.  Cabbage.  Carrots.  Raw peas.  Kidney beans.  Lima beans.  Whole-grain bread.  Whole-grain cereal. Add foods with fiber to your diet a little at a time. This will let your body get used to them. Too much fiber at once might cause gas and swelling of your abdomen. This can trigger symptoms in a  person with IBS. Caregivers usually recommend a diet with enough fiber to produce soft, painless bowel movements. High fiber diets may cause gas and bloating. However, these symptoms often go away within a few weeks, as your body adjusts. In many cases, dietary fiber may lessen IBS symptoms, particularly constipation. However, it may not help pain or diarrhea. High fiber diets keep the colon mildly enlarged (distended) with the added fiber. This may help prevent spasms in the colon. Some forms of fiber also keep water in the stool, thereby preventing hard stools that are  difficult to pass.  Besides telling you to eat more foods with fiber, your caregiver may also tell you to get more fiber by taking a fiber pill or drinking water mixed with a special high fiber powder. An example of this is a natural fiber laxative containing psyllium seed.  TIPS  Large meals can cause cramping and diarrhea in people with IBS. If this happens to you, try eating 4 or 5 small meals a day, or try eating less at each of your usual 3 meals. It may also help if your meals are low in fat and high in carbohydrates. Examples of carbohydrates are pasta, rice, whole-grain breads and cereals, fruits, and vegetables.  If dairy products cause your symptoms to flare up, you can try eating less of those foods. You might be able to handle yogurt better than other dairy products, because it contains bacteria that helps with digestion. Dairy products are an important source of calcium and other nutrients. If you need to avoid dairy products, be sure to talk with a Registered Dietitian about getting these nutrients through other food sources.  Drink enough water and fluids to keep your urine clear or pale yellow. This is important, especially if you have diarrhea. FOR MORE INFORMATION  International Foundation for Functional Gastrointestinal Disorders: www.iffgd.org  National Digestive Diseases Information Clearinghouse: digestive.StageSync.siniddk.nih.gov Document Released: 06/09/2003 Document Revised: 06/11/2011 Document Reviewed: 06/19/2013 [facility] Patient Information 2015 [place], [place]. This information is not intended to replace advice given to you by your health care provider. Make sure you discuss any questions you have with your health care provider.

## 2014-10-13 NOTE — ED Provider Notes (Signed)
[facility] Emergency Department Provider Note     Time seen: ----------------------------------------- 2:45 PM on 10/13/2014 -----------------------------------------    I have reviewed the triage vital signs and the nursing notes.   HISTORY  Chief Complaint Abdominal Pain    HPI Jaime Shannon is a 34 y.o. male who presents ER with acute onset abdominal pain that started today at work, is sharp and stabbing. Doesn't history kidney stones but notes it does not feel like that. He states for the last 10 days he's had watery diarrhea. Pain is currently severe, nothing makes it better or worse. He does not have any other associated symptoms   History reviewed. No pertinent past medical history.  There are no active problems to display for this patient.   Past Surgical History  Procedure Laterality Date  . Abdominal surgery    . Surgery scrotal / testicular      Allergies Review of patient's allergies indicates no known allergies.  Social History History  Substance Use Topics  . Smoking status: Current Every Day Smoker  . Smokeless tobacco: Not on file  . Alcohol Use: Yes    Review of Systems Constitutional: Negative for fever. Eyes: Negative for visual changes. ENT: Negative for sore throat. Cardiovascular: Negative for chest pain. Respiratory: Negative for shortness of breath. Gastrointestinal: Positive for abdominal pain and diarrhea Genitourinary: Negative for dysuria. Musculoskeletal: Negative for back pain. Skin: Negative for rash. Neurological: Negative for headaches, focal weakness or numbness.  10-point ROS otherwise negative.  ____________________________________________   PHYSICAL EXAM:  VITAL SIGNS: ED Triage Vitals  Enc Vitals Group     BP 10/13/14 1429 145/89 mmHg     Pulse Rate 10/13/14 1429 113     Resp 10/13/14 1429 18     Temp 10/13/14 1429 98 F (36.7 C)     Temp src --      SpO2 10/13/14 1429 100 %    Weight 10/13/14 1430 187 lb (84.823 kg)     Height 10/13/14 1430  (1.753 m)     Head Cir --      Peak Flow --      Pain Score 10/13/14 1430 8     Pain Loc --      Pain Edu? --      Excl. in GC? --     Constitutional: Alert and oriented. Well appearing and in no distress. Eyes: Conjunctivae are normal. PERRL. Normal extraocular movements. ENT   Head: Normocephalic and atraumatic.   Nose: No congestion/rhinnorhea.   Mouth/Throat: Mucous membranes are moist.   Neck: No stridor. Cardiovascular: Normal rate, regular rhythm. Normal and symmetric distal pulses are present in all extremities. No murmurs, rubs, or gallops. Respiratory: Normal respiratory effort without tachypnea nor retractions. Breath sounds are clear and equal bilaterally. No wheezes/rales/rhonchi. Gastrointestinal: Hyperactive bowel sounds with specific left lower quadrant tenderness, no rebound or guarding. Musculoskeletal: Nontender with normal range of motion in all extremities. No joint effusions.  No lower extremity tenderness nor edema. Neurologic:  Normal speech and language. No gross focal neurologic deficits are appreciated. Speech is normal. No gait instability. Skin:  Skin is warm, dry and intact. No rash noted. Psychiatric: Mood and affect are normal. Speech and behavior are normal. Patient exhibits appropriate insight and judgment.  ____________________________________________  ED COURSE:  Pertinent labs & imaging results that were available during [facility] care [facility]see chart for details). Patient with  significant left-sided abdominal tenderness, questionable diverticulitis. Possible kidney stone ____________________________________________    LABS (pertinent positives/negatives)  Labs Reviewed  COMPREHENSIVE METABOLIC PANEL - Abnormal; Notable for the following:    Glucose, Bld 102 (*)    All other components within  normal limits  URINALYSIS COMPLETEWITH MICROSCOPIC ([facility] ONLY) - Abnormal; Notable for the following:    Color, Urine YELLOW (*)    APPearance CLEAR (*)    All other components within normal limits  LIPASE, BLOOD  CBC    RADIOLOGY Images were viewed by me  CT abdomen and pelvis with contrast  FINDINGS: Lower chest: Lung bases are clear. Normal heart size.  Hepatobiliary: Normal liver. Normal gallbladder. No intrahepatic or extrahepatic biliary ductal dilatation.  Pancreas: Normal pancreas.  Spleen: Normal spleen.  Adrenals/Urinary Tract: Normal adrenal glands. Normal kidneys. No obstructive uropathy. No urolithiasis. Normal bladder.  Stomach/Bowel: No bowel wall thickening or dilatation. No pneumatosis, pneumoperitoneum or portal venous gas. No abdominal or pelvic free fluid. Normal appendix.  Vascular/Lymphatic: Normal caliber of abdominal aorta. No abdominal or pelvic lymphadenopathy.  Reproductive: Prostatic calcifications. Normal seminal vesicles.  Other: No fluid collection or hematoma.  Musculoskeletal: No lytic or sclerotic osseous lesion. No acute osseous abnormality. Moderate left facet arthropathy at L5-S1.  IMPRESSION: No acute abdominal or pelvic pathology. ____________________________________________  FINAL ASSESSMENT AND PLAN  Abdominal pain  Plan: Patient with lab and imaging as dictated above. No clear etiology for symptoms. Patient does report that he daily has vomiting and is recently had diarrhea. He'll be referred to GI for outpatient follow-up. Will discharge with antiemetics and Bentyl.   Emily FilbertWilliams, Ardie Mclennan E, MD   Emily FilbertJonathan E Lindee Leason, MD 10/13/14 [phone]

## 2014-11-28 ENCOUNTER — Emergency Department
Admission: EM | Admit: 2014-11-28 | Discharge: 2014-11-28 | Disposition: A | Discharge status: Home / Self Care | Payer: Self-pay | Attending: Emergency Medicine | Admitting: Emergency Medicine

## 2014-11-28 ENCOUNTER — Encounter: Payer: Self-pay | Admitting: Emergency Medicine

## 2014-11-28 DIAGNOSIS — Y9389 Activity, other specified: Secondary | ICD-10-CM | POA: Insufficient documentation

## 2014-11-28 DIAGNOSIS — Y9289 Other specified places as the place of occurrence of the external cause: Secondary | ICD-10-CM | POA: Insufficient documentation

## 2014-11-28 DIAGNOSIS — Z720 Tobacco use: Secondary | ICD-10-CM | POA: Insufficient documentation

## 2014-11-28 DIAGNOSIS — W260XXA Contact with knife, initial encounter: Secondary | ICD-10-CM | POA: Insufficient documentation

## 2014-11-28 DIAGNOSIS — S61411A Laceration without foreign body of right hand, initial encounter: Secondary | ICD-10-CM | POA: Insufficient documentation

## 2014-11-28 DIAGNOSIS — Y998 Other external cause status: Secondary | ICD-10-CM | POA: Insufficient documentation

## 2014-11-28 MED ORDER — LIDOCAINE HCL (PF) 1 % IJ SOLN
INTRAMUSCULAR | Status: DC
Start: 2014-11-28 — End: 2014-11-29
  Filled 2014-11-28: qty 5

## 2014-11-28 MED ORDER — LIDOCAINE HCL (PF) 1 % IJ SOLN: 5.0000 mL | Freq: Once | INTRAMUSCULAR | Status: DC

## 2014-11-28 NOTE — Discharge Instructions (Signed)
WOUND CARE °Please return in 12-14  days to have your °stitches/staples removed or sooner if you have concerns. °• Keep area clean and dry for 24 hours. Do not remove °bandage, if applied. °• After 24 hours, remove bandage and wash wound °gently with mild soap and warm water. Reapply °a new bandage after cleaning wound, if directed. °• Continue daily cleansing with soap and water until °stitches/staples are removed. °• Do not apply any ointments or creams to the wound °while stitches/staples are in place, as this may cause °delayed healing. °• Notify the office if you experience any of the following °signs of infection: Swelling, redness, pus drainage, °streaking, fever >101.0 F °• Notify the office if you experience excessive bleeding °that does not stop after 15-20 minutes of constant, firm °pressure. ° ° ° °

## 2014-11-28 NOTE — ED Provider Notes (Signed)
[facility] Emergency Department Provider Note  ____________________________________________  Time seen: Approximately 8:03 PM  I have reviewed the triage vital signs and the nursing notes.   HISTORY  Chief Complaint Extremity Laceration   HPI Jaime Shannon is a 34 y.o. male is here with laceration to his right hand at the base of his third proximal phalanx. There is no active bleeding at this time. Patient states that it occurred approximately 2 hours ago. He states he is up-to-date on his tetanus shot less than 3 years. Currently his pain is 6 out of 10. He is not taking any over-the-counter medication for this. He states he was using a knife to cut some copper wiring when he was cut. He denies any difficulty moving his hand.   No past medical history on file.  There are no active problems to display for this patient.   Past Surgical History  Procedure Laterality Date  . Abdominal surgery    . Surgery scrotal / testicular      Current Outpatient Rx  Name  Route  Sig  Dispense  Refill  . dicyclomine (BENTYL) 20 MG tablet   Oral   Take 0.5 tablets (10 mg total) by mouth 3 (three) times daily as needed for spasms.   20 tablet   0   . ondansetron (ZOFRAN) 4 MG tablet   Oral   Take 1 tablet (4 mg total) by mouth daily as needed for nausea or vomiting.   20 tablet   1     Allergies Promethazine  History reviewed. No pertinent family history.  Social History Social History  Substance Use Topics  . Smoking status: Current Every Day Smoker  . Smokeless tobacco: None  . Alcohol Use: Yes    Review of Systems Constitutional: No fever/chills Cardiovascular: Denies chest pain. Respiratory: Denies shortness of breath. Gastrointestinal:   No nausea, no vomiting. Genitourinary: Negative for dysuria. Musculoskeletal: Negative for back pain. Skin: Negative for rash. Positive for laceration Neurological: Negative for headaches, focal weakness  or numbness.  10-point ROS otherwise negative.  ____________________________________________   PHYSICAL EXAM:  VITAL SIGNS: ED Triage Vitals  Enc Vitals Group     BP 11/28/14 1901 146/90 mmHg     Pulse Rate 11/28/14 1901 124     Resp 11/28/14 1901 16     Temp 11/28/14 1901 98.3 F (36.8 C)     Temp Source 11/28/14 1901 Oral     SpO2 11/28/14 1901 98 %     Weight --      Height --      Head Cir --      Peak Flow --      Pain Score 11/28/14 1901 6     Pain Loc --      Pain Edu? --      Excl. in GC? --     Constitutional: Alert and oriented. Well appearing and in no acute distress. Eyes: Conjunctivae are normal. PERRL. EOMI. Head: Atraumatic. Nose: No congestion/rhinnorhea. Neck: No stridor.   Cardiovascular: Normal rate, regular rhythm. Grossly normal heart sounds.  Good peripheral circulation. Respiratory: Normal respiratory effort.  No retractions. Lungs CTAB. Gastrointestinal:. No distention Musculoskeletal: Right hand range of motion within normal limits. Laceration as described above. No lower extremity tenderness nor edema.  No joint effusions. Neurologic:  Normal speech and language. No gross focal neurologic deficits are appreciated. No gait instability. Skin:  Skin is warm, dry and intact. No rash noted. There is a laceration  on the dorsum of the right hand at the base of third finger. Bleeding is  under control at this time. Psychiatric: Mood and affect are normal. Speech and behavior are normal.  ____________________________________________   LABS (all labs ordered are listed, but only abnormal results are displayed)  Labs Reviewed - No data to display _ PROCEDURES  Procedure(s) performed: LACERATION REPAIR Performed by: Tommi Rumps Authorized by: Tommi Rumps Consent: Verbal consent obtained. Risks and benefits: risks, benefits and alternatives were discussed Consent given by: patient Patient identity confirmed: provided demographic  data Prepped and Draped in normal sterile fashion Wound explored  Laceration Location: Right hand dorsum  Laceration Length: 1.3 cm  No Foreign Bodies seen or palpated  Anesthesia: local infiltration  Local anesthetic: lidocaine 1 % without epinephrine  Anesthetic total: 3 ml  Irrigation method: syringe Amount of cleaning: standard  Skin closure: 4-0 Ethilon   Number of sutures: 4   Technique: Simple interrupted   Patient tolerance: Patient tolerated the procedure well with no immediate complications.    Critical Care performed: No  ____________________________________________   INITIAL IMPRESSION / ASSESSMENT AND PLAN / ED COURSE  Pertinent labs & imaging results that were available during my care of the patient were reviewed by me and considered in my medical decision making (see chart for details).  Patient was given instructions on how to take care of his laceration. He is also given a note for work to keep clean and dry. He is return in 12-14 days for suture removal. ____________________________________________   FINAL CLINICAL IMPRESSION(S) / ED DIAGNOSES  Final diagnoses:  Laceration of right hand, initial encounter      Tommi Rumps, PA-C 11/28/14 1610  Emily Filbert, MD 11/28/14 2124

## 2014-11-28 NOTE — ED Notes (Signed)
AAOx3.  Skin warm and dry.  Laceration well approximated, sutures in place.  Bleeding controlled. NAD

## 2014-11-28 NOTE — ED Notes (Signed)
Pt here for laceration to middle knuckle of right hand.

## 2015-01-09 ENCOUNTER — Emergency Department
Admission: EM | Admit: 2015-01-09 | Discharge: 2015-01-09 | Disposition: A | Discharge status: Home / Self Care | Payer: Self-pay | Attending: Emergency Medicine | Admitting: Emergency Medicine

## 2015-01-09 DIAGNOSIS — J028 Acute pharyngitis due to other specified organisms: Secondary | ICD-10-CM

## 2015-01-09 DIAGNOSIS — R110 Nausea: Secondary | ICD-10-CM | POA: Insufficient documentation

## 2015-01-09 DIAGNOSIS — J029 Acute pharyngitis, unspecified: Secondary | ICD-10-CM | POA: Insufficient documentation

## 2015-01-09 DIAGNOSIS — B9789 Other viral agents as the cause of diseases classified elsewhere: Secondary | ICD-10-CM

## 2015-01-09 DIAGNOSIS — R197 Diarrhea, unspecified: Secondary | ICD-10-CM | POA: Insufficient documentation

## 2015-01-09 DIAGNOSIS — Z720 Tobacco use: Secondary | ICD-10-CM | POA: Insufficient documentation

## 2015-01-09 LAB — POCT RAPID STREP A: STREPTOCOCCUS, GROUP A SCREEN (DIRECT): NEGATIVE

## 2015-01-09 MED ORDER — LIDOCAINE VISCOUS 2 % MT SOLN
15.0000 mL | Freq: Once | OROMUCOSAL | Status: AC
  Administered 2015-01-09: 15 mL via OROMUCOSAL
  Filled 2015-01-09: qty 15

## 2015-01-09 NOTE — ED Provider Notes (Signed)
[facility] Emergency Department Provider Note ____________________________________________  Time seen: 1349  I have reviewed the triage vital signs and the nursing notes.  HISTORY  Chief Complaint  URI and Sore Throat  HPI Jaime Shannon is a 34 y.o. male reports sore throat since Thursday, as well as sneezing and ear fullness bilaterally. He denies interim fevers, chills, or sweats. He has noted intermittent diarrhea, nausea, and bodyaches. He has dosed Mucinex DM and Tylenol suspension. He denies sick contacts or recent travel.   No past medical history on file.  There are no active problems to display for this patient.  Past Surgical History  Procedure Laterality Date  . Abdominal surgery    . Surgery scrotal / testicular      Current Outpatient Rx  Name  Route  Sig  Dispense  Refill  . dicyclomine (BENTYL) 20 MG tablet   Oral   Take 0.5 tablets (10 mg total) by mouth 3 (three) times daily as needed for spasms.   20 tablet   0   . ondansetron (ZOFRAN) 4 MG tablet   Oral   Take 1 tablet (4 mg total) by mouth daily as needed for nausea or vomiting.   20 tablet   1    Allergies Promethazine  No family history on file.  Social History Social History  Substance Use Topics  . Smoking status: Current Every Day Smoker  . Smokeless tobacco: Not on file  . Alcohol Use: Yes   Review of Systems  Constitutional: Negative for fever. Eyes: Negative for visual changes. ENT: Positive for sore throat. Cardiovascular: Negative for chest pain. Respiratory: Negative for shortness of breath. Gastrointestinal: Negative for abdominal pain or vomiting. Reports nausea and diarrhea. Genitourinary: Negative for dysuria. Musculoskeletal: Negative for back pain. Skin: Negative for rash. Neurological: Negative for headaches, focal weakness or numbness. ____________________________________________  PHYSICAL EXAM:  VITAL SIGNS: ED Triage Vitals  Enc  Vitals Group     BP 01/09/15 1320 146/92 mmHg     Pulse Rate 01/09/15 1320 89     Resp 01/09/15 1320 18     Temp 01/09/15 1320 98.5 F (36.9 C)     Temp Source 01/09/15 1320 Oral     SpO2 01/09/15 1320 97 %     Weight 01/09/15 1320 193 lb (87.544 kg)     Height 01/09/15 1320  (1.753 m)     Head Cir --      Peak Flow --      Pain Score 01/09/15 1321 10     Pain Loc --      Pain Edu? --      Excl. in GC? --    Constitutional: Alert and oriented. Well appearing and in no distress. Head: Normocephalic and atraumatic.       Eyes: Conjunctivae are normal. PERRL. Normal extraocular movements       Ears: Canals clear. TMs intact bilaterally.   Nose: No congestion/rhinorrhea.   Mouth/Throat: Mucous membranes are moist. Uvula midline, general erythema of the oropharynx.    Neck: Supple. No thyromegaly. Hematological/Lymphatic/Immunological: No cervical lymphadenopathy. Cardiovascular: Normal rate, regular rhythm.  Respiratory: Normal respiratory effort. No wheezes/rales/rhonchi. Gastrointestinal: Soft and nontender. No distention. Musculoskeletal: Nontender with normal range of motion in all extremities.  Neurologic:  Normal gait without ataxia. Normal speech and language. No gross focal neurologic deficits are appreciated. Skin:  Skin is warm, dry and intact. No rash noted. Psychiatric: Mood and affect are normal. Patient exhibits appropriate insight and  judgment. ____________________________________________   LABS (pertinent positives/negatives) Labs Reviewed  CULTURE, GROUP A STREP ([facility] ONLY)  POCT RAPID STREP A  ____________________________________________  PROCEDURES  Viscous Lido 2% gargle ____________________________________________  INITIAL IMPRESSION / ASSESSMENT AND PLAN / ED COURSE  Sore throat, and URI symptoms without a positive rapid strep test. Patient will await throat culture results, and continue to treat pain with warm salt water gargles or  Benadryl/Maalox swishes. Return to the ED as needed. ____________________________________________  FINAL CLINICAL IMPRESSION(S) / ED DIAGNOSES  Final diagnoses:  Sore throat (viral)      Lissa Hoard, PA-C 01/09/15 1543  Arnaldo Natal, MD 01/09/15 1640

## 2015-01-09 NOTE — ED Notes (Signed)
Pt reports sore throat and cold sx's x 2 days.

## 2015-01-09 NOTE — Discharge Instructions (Signed)
Sore Throat A sore throat is pain, burning, irritation, or scratchiness of the throat. There is often pain or tenderness when swallowing or talking. A sore throat may be accompanied by other symptoms, such as coughing, sneezing, fever, and swollen neck glands. A sore throat is often the first sign of another sickness, such as a cold, flu, strep throat, or mononucleosis (commonly known as mono). Most sore throats go away without medical treatment. CAUSES  The most common causes of a sore throat include:  A viral infection, such as a cold, flu, or mono.  A bacterial infection, such as strep throat, tonsillitis, or whooping cough.  Seasonal allergies.  Dryness in the air.  Irritants, such as smoke or pollution.  Gastroesophageal reflux disease (GERD). HOME CARE INSTRUCTIONS   Only take over-the-counter medicines as directed by your caregiver.  Drink enough fluids to keep your urine clear or pale yellow.  Rest as needed.  Try using throat sprays, lozenges, or sucking on hard candy to ease any pain (if older than 4 years or as directed).  Sip warm liquids, such as broth, herbal tea, or warm water with honey to relieve pain temporarily. You may also eat or drink cold or frozen liquids such as frozen ice pops.  Gargle with salt water (mix 1 tsp salt with 8 oz of water).  Do not smoke and avoid secondhand smoke.  Put a cool-mist humidifier in your bedroom at night to moisten the air. You can also turn on a hot shower and sit in the bathroom with the door closed for 5-10 minutes. SEEK IMMEDIATE MEDICAL CARE IF:  You have difficulty breathing.  You are unable to swallow fluids, soft foods, or your saliva.  You have increased swelling in the throat.  Your sore throat does not get better in 7 days.  You have nausea and vomiting.  You have a fever or persistent symptoms for more than 2-3 days.  You have a fever and your symptoms suddenly get worse. MAKE SURE YOU:   Understand  these instructions.  Will watch your condition.  Will get help right away if you are not doing well or get worse.   This information is not intended to replace advice given to you by your health care provider. Make sure you discuss any questions you have with your health care provider.   Document Released: 04/26/2004 Document Revised: 04/09/2014 Document Reviewed: 11/25/2011 Elsevier Interactive Patient Education 2016 [employer].   Your exam was not conclusive for strep throat. You may receive a call regarding your throat culture, if positive. Continue to treat symptoms. Rinse with warm salty water or mix and gargle equal parts of Benadryl elixir + Maalox as needed. Dose OTC Allegra-D/Claritin-D/Zyrtec-D as needed.

## 2015-01-11 LAB — CULTURE, GROUP A STREP (THRC): Special Requests: NORMAL

## 2015-01-12 ENCOUNTER — Encounter: Payer: Self-pay | Admitting: Emergency Medicine

## 2015-01-12 ENCOUNTER — Emergency Department: Payer: Self-pay

## 2015-01-12 ENCOUNTER — Emergency Department
Admission: EM | Admit: 2015-01-12 | Discharge: 2015-01-12 | Disposition: A | Discharge status: Home / Self Care | Payer: Self-pay | Attending: Emergency Medicine | Admitting: Emergency Medicine

## 2015-01-12 DIAGNOSIS — Z79899 Other long term (current) drug therapy: Secondary | ICD-10-CM | POA: Insufficient documentation

## 2015-01-12 DIAGNOSIS — Z720 Tobacco use: Secondary | ICD-10-CM | POA: Insufficient documentation

## 2015-01-12 DIAGNOSIS — K529 Noninfective gastroenteritis and colitis, unspecified: Secondary | ICD-10-CM | POA: Insufficient documentation

## 2015-01-12 DIAGNOSIS — G8929 Other chronic pain: Secondary | ICD-10-CM | POA: Insufficient documentation

## 2015-01-12 DIAGNOSIS — R109 Unspecified abdominal pain: Secondary | ICD-10-CM | POA: Insufficient documentation

## 2015-01-12 HISTORY — DX: Calculus of kidney: N20.0

## 2015-01-12 LAB — URINALYSIS COMPLETE WITH MICROSCOPIC (ARMC ONLY)
BILIRUBIN URINE: NEGATIVE
Bacteria, UA: NONE SEEN
Glucose, UA: NEGATIVE mg/dL
LEUKOCYTES UA: NEGATIVE
Nitrite: NEGATIVE
PROTEIN: NEGATIVE mg/dL
Specific Gravity, Urine: 1.015 (ref 1.005–1.030)
pH: 7 (ref 5.0–8.0)

## 2015-01-12 LAB — COMPREHENSIVE METABOLIC PANEL
ALK PHOS: 89 U/L (ref 38–126)
ALT: 24 U/L (ref 17–63)
AST: 19 U/L (ref 15–41)
Albumin: 4.5 g/dL (ref 3.5–5.0)
Anion gap: 9 (ref 5–15)
BUN: 13 mg/dL (ref 6–20)
CALCIUM: 10 mg/dL (ref 8.9–10.3)
CHLORIDE: 103 mmol/L (ref 101–111)
CO2: 28 mmol/L (ref 22–32)
CREATININE: 0.76 mg/dL (ref 0.61–1.24)
GFR calc non Af Amer: >60 mL/min (ref >60)
Glucose, Bld: 114 mg/dL — ABNORMAL HIGH (ref 65–99)
Potassium: 3.6 mmol/L (ref 3.5–5.1)
Sodium: 140 mmol/L (ref 135–145)
TOTAL PROTEIN: 8.3 g/dL — AB (ref 6.5–8.1)
Total Bilirubin: 0.5 mg/dL (ref 0.3–1.2)

## 2015-01-12 LAB — CBC WITH DIFFERENTIAL/PLATELET
Basophils Absolute: 0.1 10*3/uL (ref 0–0.1)
Basophils Relative: 1 %
EOS PCT: 0 %
Eosinophils Absolute: 0 10*3/uL (ref 0–0.7)
HCT: 48.5 % (ref 40.0–52.0)
Hemoglobin: 16.1 g/dL (ref 13.0–18.0)
LYMPHS ABS: 1.8 10*3/uL (ref 1.0–3.6)
Lymphocytes Relative: 17 %
MCH: 29.3 pg (ref 26.0–34.0)
MCHC: 33.1 g/dL (ref 32.0–36.0)
MCV: 88.5 fL (ref 80.0–100.0)
MONO ABS: 0.7 10*3/uL (ref 0.2–1.0)
Monocytes Relative: 6 %
Neutro Abs: 8.2 10*3/uL — ABNORMAL HIGH (ref 1.4–6.5)
Neutrophils Relative %: 76 %
Platelets: 400 10*3/uL (ref 150–440)
RBC: 5.48 MIL/uL (ref 4.40–5.90)
RDW: 13.5 % (ref 11.5–14.5)
WBC: 10.8 10*3/uL — AB (ref 3.8–10.6)

## 2015-01-12 LAB — LIPASE, BLOOD: Lipase: 21 U/L — ABNORMAL LOW (ref 22–51)

## 2015-01-12 MED ORDER — IOHEXOL 300 MG/ML  SOLN
100.0000 mL | Freq: Once | INTRAMUSCULAR | Status: AC | PRN
  Administered 2015-01-12: 100 mL via INTRAVENOUS

## 2015-01-12 MED ORDER — ONDANSETRON HCL 4 MG PO TABS: 4.0000 mg | ORAL_TABLET | Freq: Every day | ORAL | Status: DC | PRN

## 2015-01-12 MED ORDER — ONDANSETRON HCL 4 MG/2ML IJ SOLN
4.0000 mg | Freq: Once | INTRAMUSCULAR | Status: AC
  Administered 2015-01-12: 4 mg via INTRAVENOUS
  Filled 2015-01-12: qty 2

## 2015-01-12 MED ORDER — IOHEXOL 240 MG/ML SOLN
25.0000 mL | Freq: Once | INTRAMUSCULAR | Status: DC | PRN
  Administered 2015-01-12: 25 mL via ORAL
  Filled 2015-01-12: qty 50

## 2015-01-12 MED ORDER — OXYCODONE-ACETAMINOPHEN 5-325 MG PO TABS
1.0000 | ORAL_TABLET | Freq: Once | ORAL | Status: AC
  Administered 2015-01-12: 1 via ORAL
  Filled 2015-01-12: qty 1

## 2015-01-12 MED ORDER — DICYCLOMINE HCL 10 MG PO CAPS: 10.0000 mg | ORAL_CAPSULE | Freq: Four times a day (QID) | ORAL | Status: DC

## 2015-01-12 MED ORDER — MORPHINE SULFATE (PF) 4 MG/ML IV SOLN
4.0000 mg | Freq: Once | INTRAVENOUS | Status: AC
  Administered 2015-01-12: 4 mg via INTRAVENOUS
  Filled 2015-01-12: qty 1

## 2015-01-12 MED ORDER — SODIUM CHLORIDE 0.9 % IV BOLUS (SEPSIS)
1000.0000 mL | Freq: Once | INTRAVENOUS | Status: AC
  Administered 2015-01-12: 1000 mL via INTRAVENOUS

## 2015-01-12 NOTE — ED Notes (Signed)
Patient transported to CT 

## 2015-01-12 NOTE — ED Notes (Signed)
Seen here recent for virus.  Says diarrhea and vomiting.  Cannot eat.

## 2015-01-12 NOTE — Discharge Instructions (Signed)
We noticed on CT scan at your prostate is enlarged. This is likely BPH, especially since you have had it since her 4320s, but we cannot know that for sure, and we ask that you follow closely with urology. Please follow up also with your GI doctor return to the emergency room for any new or worrisome symptoms including increased pain vomiting or fever or if you feel worse in any way

## 2015-01-12 NOTE — ED Provider Notes (Addendum)
[facility] Emergency Department Provider Note  ____________________________________________   I have reviewed the triage vital signs and the nursing notes.   HISTORY  Chief Complaint Emesis and Diarrhea    HPI Rocky LinkKevin P Brandy is a 34 y.o. male with a history of chronic abdominal pain, who is been told he has irritable bowel syndrome but he "does not believe it" follows with a GI doctor first chronic abdominal discomfort and vomiting. Also has chronic reflux disease. He states that he has been vomiting since "I was 34 years old". Stress makes him vomit and he has a history of bipolar and being stressed. No SI or HI. He states he's been vomiting for last for 5 days. He denies any fever or chills but he has had some loose stools as well. This is all typical of his flares. Initially, it seemed as if he is complaining of pain in the left side and when I presented to him he was not complaining of pain more on the right. Patient has had no fevers. He denies any ongoing URI symptoms of a still taking Sudafed and I have advised him to stop it. He denies any melena bright red blood per rectum or hematemesis. He has had no bloody or bilious emesis. He states he "could not" go to work this morning and got his wife is here. Thank  Past Medical History  Diagnosis Date  . Kidney stone     There are no active problems to display for this patient.   Past Surgical History  Procedure Laterality Date  . Abdominal surgery    . Surgery scrotal / testicular      Current Outpatient Rx  Name  Route  Sig  Dispense  Refill  . omeprazole (PRILOSEC) 20 MG capsule   Oral   Take 40 mg by mouth every evening.         . Pseudoephedrine HCl 240 MG TB24   Oral   Take 240 mg by mouth daily.         . traZODone (DESYREL) 50 MG tablet   Oral   Take 100 mg by mouth at bedtime as needed for sleep.         Marland Kitchen. dicyclomine (BENTYL) 20 MG tablet   Oral   Take 0.5 tablets (10 mg total)  by mouth 3 (three) times daily as needed for spasms. Patient not taking: Reported on 01/12/2015   20 tablet   0   . ondansetron (ZOFRAN) 4 MG tablet   Oral   Take 1 tablet (4 mg total) by mouth daily as needed for nausea or vomiting. Patient not taking: Reported on 01/12/2015   20 tablet   1     Allergies Promethazine  No family history on file.  Social History Social History  Substance Use Topics  . Smoking status: Current Every Day Smoker  . Smokeless tobacco: None  . Alcohol Use: Yes    Review of Systems Constitutional: No fever/chills Eyes: No visual changes. ENT: No sore throat. No stiff neck no neck pain Cardiovascular: Denies chest pain. Respiratory: Denies shortness of breath. Gastrointestinal:   See history of present illness Genitourinary: Negative for dysuria. Musculoskeletal: Negative lower extremity swelling Skin: Negative for rash. Neurological: Negative for headaches, focal weakness or numbness. 10-point ROS otherwise negative.  ____________________________________________   PHYSICAL EXAM:  VITAL SIGNS: ED Triage Vitals  Enc Vitals Group     BP --      Pulse --  Resp --      Temp 01/12/15 0843 98.3 F (36.8 C)     Temp Source 01/12/15 0843 Oral     SpO2 --      Weight 01/12/15 0843 190 lb (86.183 kg)     Height 01/12/15 0843  (1.753 m)     Head Cir --      Peak Flow --      Pain Score 01/12/15 0842 10     Pain Loc --      Pain Edu? --      Excl. in GC? --     Constitutional: Alert and oriented. Well appearing and in no acute distress. Eyes: Conjunctivae are normal. PERRL. EOMI. Head: Atraumatic. Nose: No congestion/rhinnorhea. Mouth/Throat: Mucous membranes are moist.  Oropharynx non-erythematous. Neck: No stridor.   Nontender with no meningismus Cardiovascular: Normal rate, regular rhythm. Grossly normal heart sounds.  Good peripheral circulation. Respiratory: Normal respiratory effort.  No retractions. Lungs  CTAB. Gastrointestinal: There is tenderness to palpation in the lower abdomen presently on the right side, somewhat distractible exam, abdomen is soft, No distention. No guarding no rebound Back:  There is no focal tenderness or step off there is no midline tenderness there are no lesions noted. there is no CVA tenderness Musculoskeletal: No lower extremity tenderness. No joint effusions, no DVT signs strong distal pulses no edema Neurologic:  Normal speech and language. No gross focal neurologic deficits are appreciated.  Skin:  Skin is warm, dry and intact. No rash noted. Psychiatric: Mood and affect are somewhat anxious. Speech and behavior are normal.  ____________________________________________   LABS (all labs ordered are listed, but only abnormal results are displayed)  Labs Reviewed  COMPREHENSIVE METABOLIC PANEL - Abnormal; Notable for the following:    Glucose, Bld 114 (*)    Total Protein 8.3 (*)    All other components within normal limits  LIPASE, BLOOD - Abnormal; Notable for the following:    Lipase 21 (*)    All other components within normal limits  CBC WITH DIFFERENTIAL/PLATELET - Abnormal; Notable for the following:    WBC 10.8 (*)    Neutro Abs 8.2 (*)    All other components within normal limits   ____________________________________________  EKG   ____________________________________________  RADIOLOGY   ____________________________________________   PROCEDURES  Procedure(s) performed: None  Critical Care performed: None  ____________________________________________   INITIAL IMPRESSION / ASSESSMENT AND PLAN / ED COURSE  Pertinent labs & imaging results that were available during my care of the patient were reviewed by me and considered in my medical decision making (see chart for details).  Patient presents with abdominal pain and vomiting for the last 5-7 days, he is very well-appearing. This is a chronic problem for him what she has been  having now for the last several decades and he is following with GI for it. However, this does not mean he cannot develop appendicitis or other pathologies. He denies surgery. We will obtain imaging as a precaution given that he does localizes pain to the right side however, I am very comfortable with his exam and his vital signs as well as his blood work. We will continue to monitor him closely. ____________________________________________  ----------------------------------------- 12:08 PM on 01/12/2015 -----------------------------------------  Extensive workup reassuring here did inform him of his prostate, he states he R he knows about it from when he was younger but he will follow up with urology. He'll follow up with GI for his chronic recurrent abdominal discomfort, serial  abdominal exams which are no evidence of acute pathology today. Patient was requesting something to take K came back, given a single oral medication here. I will not prescribe narcotic prescriptions given his chronic pain issues, but we will have him closely follow up as an outpatient. Return precautions and follow-up given and understood.  FINAL CLINICAL IMPRESSION(S) / ED DIAGNOSES  Final diagnoses:  None      Jeanmarie Plant, MD 01/12/15 6045  Jeanmarie Plant, MD 01/12/15 4098  Jeanmarie Plant, MD 01/12/15 [phone]

## 2015-04-06 ENCOUNTER — Encounter: Payer: Self-pay | Admitting: Emergency Medicine

## 2015-04-06 ENCOUNTER — Emergency Department
Admission: EM | Admit: 2015-04-06 | Discharge: 2015-04-06 | Disposition: A | Discharge status: Other Acute Inpatient Hospital | Payer: 59 | Attending: Emergency Medicine | Admitting: Emergency Medicine

## 2015-04-06 DIAGNOSIS — F309 Manic episode, unspecified: Secondary | ICD-10-CM | POA: Diagnosis not present

## 2015-04-06 DIAGNOSIS — F17200 Nicotine dependence, unspecified, uncomplicated: Secondary | ICD-10-CM | POA: Diagnosis not present

## 2015-04-06 DIAGNOSIS — F3160 Bipolar disorder, current episode mixed, unspecified: Secondary | ICD-10-CM | POA: Diagnosis not present

## 2015-04-06 DIAGNOSIS — F419 Anxiety disorder, unspecified: Secondary | ICD-10-CM | POA: Insufficient documentation

## 2015-04-06 DIAGNOSIS — Z79899 Other long term (current) drug therapy: Secondary | ICD-10-CM | POA: Insufficient documentation

## 2015-04-06 DIAGNOSIS — F1210 Cannabis abuse, uncomplicated: Secondary | ICD-10-CM | POA: Diagnosis not present

## 2015-04-06 DIAGNOSIS — R45851 Suicidal ideations: Secondary | ICD-10-CM

## 2015-04-06 DIAGNOSIS — F319 Bipolar disorder, unspecified: Secondary | ICD-10-CM

## 2015-04-06 HISTORY — DX: Bipolar disorder, unspecified: F31.9

## 2015-04-06 LAB — URINE DRUG SCREEN, QUALITATIVE (ARMC ONLY)
AMPHETAMINES, UR SCREEN: NOT DETECTED
BARBITURATES, UR SCREEN: NOT DETECTED
Benzodiazepine, Ur Scrn: NOT DETECTED
COCAINE METABOLITE, UR ~~LOC~~: NOT DETECTED
Cannabinoid 50 Ng, Ur ~~LOC~~: POSITIVE — AB
MDMA (Ecstasy)Ur Screen: NOT DETECTED
METHADONE SCREEN, URINE: NOT DETECTED
OPIATE, UR SCREEN: NOT DETECTED
PHENCYCLIDINE (PCP) UR S: NOT DETECTED
Tricyclic, Ur Screen: NOT DETECTED

## 2015-04-06 LAB — COMPREHENSIVE METABOLIC PANEL
ALBUMIN: 4.9 g/dL (ref 3.5–5.0)
ALK PHOS: 74 U/L (ref 38–126)
ALT: 27 U/L (ref 17–63)
ANION GAP: 6 (ref 5–15)
AST: 25 U/L (ref 15–41)
BILIRUBIN TOTAL: 0.8 mg/dL (ref 0.3–1.2)
BUN: 9 mg/dL (ref 6–20)
CALCIUM: 9.6 mg/dL (ref 8.9–10.3)
CO2: 28 mmol/L (ref 22–32)
CREATININE: 0.75 mg/dL (ref 0.61–1.24)
Chloride: 105 mmol/L (ref 101–111)
GFR calc Af Amer: >60 mL/min (ref >60)
GFR calc non Af Amer: >60 mL/min (ref >60)
GLUCOSE: 125 mg/dL — AB (ref 65–99)
Potassium: 4.3 mmol/L (ref 3.5–5.1)
SODIUM: 139 mmol/L (ref 135–145)
TOTAL PROTEIN: 8.1 g/dL (ref 6.5–8.1)

## 2015-04-06 LAB — ETHANOL: Alcohol, Ethyl (B): <5 mg/dL (ref <5)

## 2015-04-06 LAB — CBC
HEMATOCRIT: 44.6 % (ref 40.0–52.0)
HEMOGLOBIN: 14.9 g/dL (ref 13.0–18.0)
MCH: 29.2 pg (ref 26.0–34.0)
MCHC: 33.5 g/dL (ref 32.0–36.0)
MCV: 87.3 fL (ref 80.0–100.0)
Platelets: 324 10*3/uL (ref 150–440)
RBC: 5.1 MIL/uL (ref 4.40–5.90)
RDW: 14.1 % (ref 11.5–14.5)
WBC: 6.6 10*3/uL (ref 3.8–10.6)

## 2015-04-06 LAB — SALICYLATE LEVEL: Salicylate Lvl: <4 mg/dL (ref 2.8–30.0)

## 2015-04-06 LAB — ACETAMINOPHEN LEVEL

## 2015-04-06 MED ORDER — ZIPRASIDONE MESYLATE 20 MG IM SOLR
INTRAMUSCULAR | Status: AC
  Administered 2015-04-06: 20 mg via INTRAMUSCULAR
  Filled 2015-04-06: qty 20

## 2015-04-06 MED ORDER — ZIPRASIDONE HCL 40 MG PO CAPS
40.0000 mg | ORAL_CAPSULE | Freq: Two times a day (BID) | ORAL | Status: DC
  Filled 2015-04-06 (×2): qty 1

## 2015-04-06 MED ORDER — ZIPRASIDONE MESYLATE 20 MG IM SOLR
20.0000 mg | Freq: Once | INTRAMUSCULAR | Status: AC
  Administered 2015-04-06: 20 mg via INTRAMUSCULAR

## 2015-04-06 MED ORDER — ZIPRASIDONE HCL 40 MG PO CAPS: 40.0000 mg | ORAL_CAPSULE | Freq: Every day | ORAL | Status: DC

## 2015-04-06 NOTE — BH Assessment (Signed)
Assessment Note  Jaime Shannon is an 35 y.o. male. Presented to the ED for a psychiatric evaluation. Pt states that his sx of derepression have increased over the past month. Pt states that he had an interaction with his 35 year old daughter on today which overwhelmed him and made him fell suicidal. Pt states that he has struggled with SI for several years and that he always has fleeting thoughts. Pt states that he uses marijuana to clam the racing thoughts he experiences and to combat the persecutory voices. Pt states that he loves his daughter and she is his motivation to continue on. Pt states his family moved away in April, 2015 and that he has limited supports. Pt also reports that he struggles with his depression during the holidays. Patient was educated about steps to take if suicide or homicide risk level increases between visits. While future psychiatric events cannot be accurately predicted, the patient does not currently require acute inpatient psychiatric care and [facility] not [facility] criteria.    Diagnosis: Bipolar 1 Disorder   Past Medical History:  Past Medical History  Diagnosis Date  . Kidney stone   . Bipolar 1 disorder [facility](HCC)     Past Surgical History  Procedure Laterality Date  . Abdominal surgery    . Surgery scrotal / testicular      Family History: No family history on file.  Social History:  reports that he has been smoking.  He does not have any smokeless tobacco history on file. He reports that he drinks alcohol. He reports that he uses illicit drugs (Marijuana).  Additional Social History:  Alcohol / Drug Use Pain Medications: Denies  Prescriptions: Denies  Over the Counter: Denies  History of alcohol / drug use?: Yes Longest period of sobriety (when/how long): unknown  Negative Consequences of Use: Financial Withdrawal Symptoms:  (Pt denies ) Substance #1 Name of Substance 1: Marijauna  1 - Age of First Use: 35  y.o. 1 - Amount (size/oz): 1-4 grams a day  1 - Frequency: daily  1 - Duration: 10 years  1 - Last Use / Amount: x2 days   CIWA: CIWA-Ar BP: (!) 145/101 mmHg Pulse Rate: 80 COWS:    Allergies:  Allergies  Allergen Reactions  . Promethazine     Home Medications:  (Not in a hospital admission)  OB/GYN Status:  No LMP for male patient.  General Assessment Data [facility] Assessment: In system Is this a Tele or Face-to-Face Assessment?: Face-to-Face Is this an Initial Assessment or a Re-assessment for this encounter?: Initial Assessment Marital status: Married Is patient pregnant?: No Pregnancy Status: No Living Arrangements: Spouse/significant other Can pt return to current living arrangement?: Yes Admission Status: Involuntary Is patient capable of signing voluntary admission?: No Referral Source: Self/Family/Friend Insurance type: [employer]   Medical Screening Exam [facility] ONLY) Medical Exam completed: Yes  Crisis Care Plan Living Arrangements: Spouse/significant other Legal Guardian: Other: (None ) Name of Psychiatrist: None Name of Therapist: None   Education Status Is patient currently in school?: No Current Grade: N/A Highest grade of school patient has completed: Some College  Risk to self with the past 6 months Suicidal Ideation: No-Not Currently/Within Last 6 Months (This Morning ) Has patient been a risk to self within the past 6 months prior to admission? : Yes Suicidal Intent: No Has patient had any suicidal intent within the past 6 months prior to admission? : No  Is patient at risk for suicide?: Yes Suicidal Plan?: No Has patient had any suicidal plan within the past 6 months prior to admission? : No Access to Means: No What has been your use of drugs/alcohol within the last 12 months?: Marijuana  Previous Attempts/Gestures: No How many times?: 0 Triggers for Past Attempts: Family contact Intentional Self  Injurious Behavior: None Family Suicide History: No Recent stressful life event(s): Loss (Comment) Persecutory voices/beliefs?: Yes Depression: Yes Depression Symptoms: Guilt, Loss of interest in usual pleasures, Feeling worthless/self pity, Isolating Substance abuse history and/or treatment for substance abuse?: Yes Suicide prevention information given to non-admitted patients: Yes  Risk to Others within the past 6 months Homicidal Ideation: No Does patient have any lifetime risk of violence toward others beyond the six months prior to admission? : No Thoughts of Harm to Others: No Current Homicidal Intent: No Current Homicidal Plan: No Access to Homicidal Means: No Identified Victim:  (N/A) History of harm to others?: No Assessment of Violence: On admission Violent Behavior Description: None Does patient have access to weapons?: No Criminal Charges Pending?: No Does patient have a court date: No Is patient on probation?: No  Psychosis Hallucinations: Auditory Delusions: Unspecified  Mental Status Report Appearance/Hygiene: In scrubs Eye Contact: Fair Motor Activity: Freedom of movement Speech: Logical/coherent Level of Consciousness: Alert Mood: Ambivalent, Suspicious Affect: Appropriate to circumstance Anxiety Level: Minimal Thought Processes: Relevant Judgement: Partial Orientation: Place, Time, Situation, Person Obsessive Compulsive Thoughts/Behaviors: Minimal  Cognitive Functioning Concentration: Good Memory: Remote Intact, Recent Intact IQ: Average Insight: Poor Impulse Control: Fair Appetite: Poor Weight Loss: 0 Weight Gain: 0 Sleep: Decreased Total Hours of Sleep: 3  ADLScreening [facility] Assessment Services) Patient's cognitive ability adequate to safely complete daily activities?: Yes Patient able to express need for assistance with ADLs?: Yes Independently performs ADLs?: Yes (appropriate for developmental age)  Prior Inpatient Therapy Prior  Inpatient Therapy: Yes Prior Therapy Facilty/Provider(s): [facility] Reason for Treatment: Depression   Prior Outpatient Therapy Prior Outpatient Therapy: No Prior Therapy Dates:  (N/A) Prior Therapy Facilty/Provider(s): N/A Reason for Treatment: N/A Does patient have an ACCT team?: No Does patient have Intensive In-House Services?  : No Does patient have P4CC services?: No  ADL Screening (condition at time of admission) Patient's cognitive ability adequate to safely complete daily activities?: Yes Patient able to express need for assistance with ADLs?: Yes Independently performs ADLs?: Yes (appropriate for developmental age)       Abuse/Neglect Assessment (Assessment to be complete while patient is alone) Physical Abuse: Denies Verbal Abuse: Yes, past (Comment) Sexual Abuse: Denies Exploitation of patient/patient's resources: Denies Self-Neglect: Denies Values / Beliefs Cultural Requests During Hospitalization: None Spiritual Requests During Hospitalization: None Consults Spiritual Care Consult Needed: No Social Work Consult Needed: No Merchant navy officer (For Healthcare) Does patient have an advance directive?: No    Additional Information 1:1 In Past 12 Months?: No CIRT Risk: No Elopement Risk: No Does patient have medical clearance?: No     Disposition:  Disposition Initial Assessment Completed for this Encounter: Yes Disposition of Patient: Referred to, Outpatient treatment ([facility] ) Type of outpatient treatment: Adult Patient referred to: Other (Comment) ([facility] )  On Site Evaluation by:   Reviewed with Physician:    Asa Saunas 04/06/2015 3:01 PM

## 2015-04-06 NOTE — ED Notes (Signed)
Patient resting quietly in room. No noted distress or abnormal behaviors noted. Will continue 15 minute checks and observation by security camera for safety. 

## 2015-04-06 NOTE — BH Assessment (Signed)
Per request of Dr. Toni Amendlapacs, writer provided the pt. with information and instructions on how to access Outpatient Mental Health & Substance Abuse Treatment ([facility])    04/06/2015 Cheryl FlashNicole Gertrue Willette, MS, NCC, LPCA Therapeutic Triage Specialist

## 2015-04-06 NOTE — Consult Note (Signed)
[facility] Consult   Reason for Consult:  Consult for 35 year old man with history of bipolar disorder who came voluntarily to the emergency room Referring Physician:  Paduchowski Patient Identification: Jaime Shannon MRN:  [id number] Principal Diagnosis: Bipolar disorder, mixed [facility]) Diagnosis:   Patient Active Problem List   Diagnosis Date Noted  . Bipolar disorder, mixed ([place]) [F31.60] 04/06/2015  . Acute anxiety [F41.9] 04/06/2015  . Marijuana abuse [F12.10] 04/06/2015    Total Time spent with patient: 1 hour  Subjective:   Jaime Shannon is a 35 y.o. male patient admitted with "I have bipolar disorder".  HPI:  Patient interviewed. Chart reviewed. Old chart reviewed. Labs and vitals reviewed. 35 year old man came to the hospital voluntarily. When he first came in he had made some statements apparently about suicidal ideation. On my interview today the patient says that he is been feeling stressed out and his brain is been feeling like it was to "loud" for the last few days. His most acute stress is that he worries about his 66-year-old daughter. She primarily lives with his ex-and he is worried that she is not being well taken care of but feels that he does not have anything he can do about it. As been upsetting him for the last few days. Patient says his sleep is been a little bit worse for the last few days. Appetite has been a little bit off. He tells me he had some fleeting thoughts of suicide but has never had any plan or intent of acting on it and does not think he ever would act on it. He is able to articulate positive plans for the future and things that are important to him in his life. Patient denies that he's been having auditory or visual hallucinations. When he talks about his brain being "loud" he says that he feels like he's having racing thoughts and anxiety. Patient has not been taking any psychiatric medicine for years. He regularly uses marijuana every  day and says that he hasn't had any for a few days. Patient denies that he has had any history of doing anything violent or any acute thoughts about doing anything violent. He says he is still been able to work regularly. Patient requested me that he be released from the emergency room and referred for outpatient follow-up in the community. He says he now has insurance and would like to finally go see someone for outpatient treatment.  Social history: Patient and his current wife live together. He says they have a good relationship. They have no children together. His 110-year-old daughter visits him on the weekends. Patient works as a Biomedical scientist. Says that he really enjoys his job.  Medical history: Denies any significant medical problems that are ongoing. He has had a past history of kidney stones but is on no current medication.  Substance abuse history: Patient states that he uses marijuana on a daily basis because he feels like it mellows him out. He does not feel like it causes him any problems. He says he drinks very little and denies that he is using any other drugs.  Past Psychiatric History: Past history of bipolar disorder. He's had a couple of previous admissions to the hospital one here in the past. Patient says he has never actually tried to kill himself or been violent to others. When he's been in the hospital before it's been for extreme anxiety and irritability. He's been treated with Depakote and Geodon among other medicines. Patient tells  me today that he thought that Geodon was the most helpful for him. He has not been going for any outpatient treatment at all.  Risk to Self: Is patient at risk for suicide?: Yes Risk to Others:   Prior Inpatient Therapy:   Prior Outpatient Therapy:    Past Medical History:  Past Medical History  Diagnosis Date  . Kidney stone   . Bipolar 1 disorder [facility])     Past Surgical History  Procedure Laterality Date  . Abdominal surgery    . Surgery scrotal  / testicular     Family History: No family history on file. Family Psychiatric  History: Patient states there is no family history of mental health problems or substance abuse problems at all Social History:  History  Alcohol Use  . Yes     History  Drug Use  . Yes  . Special: Marijuana    Comment: every day for two weeks    Social History   Social History  . Marital Status: Married    Spouse Name: N/A  . Number of Children: N/A  . Years of Education: N/A   Social History Main Topics  . Smoking status: Current Every Day Smoker  . Smokeless tobacco: None  . Alcohol Use: Yes  . Drug Use: Yes    Special: Marijuana     Comment: every day for two weeks  . Sexual Activity: Not Asked   Other Topics Concern  . None   Social History Narrative   Additional Social History:                          Allergies:   Allergies  Allergen Reactions  . Promethazine     Labs:  Results for orders placed or performed during the hospital encounter of 04/06/15 (from the past 48 hour(s))  Comprehensive metabolic panel     Status: Abnormal   Collection Time: 04/06/15  9:41 AM  Result Value Ref Range   Sodium 139 135 - 145 mmol/L   Potassium 4.3 3.5 - 5.1 mmol/L   Chloride 105 101 - 111 mmol/L   CO2 28 22 - 32 mmol/L   Glucose, Bld 125 (H) 65 - 99 mg/dL   BUN 9 6 - 20 mg/dL   Creatinine, Ser 0.75 0.61 - 1.24 mg/dL   Calcium 9.6 8.9 - 10.3 mg/dL   Total Protein 8.1 6.5 - 8.1 g/dL   Albumin 4.9 3.5 - 5.0 g/dL   AST 25 15 - 41 U/L   ALT 27 17 - 63 U/L   Alkaline Phosphatase 74 38 - 126 U/L   Total Bilirubin 0.8 0.3 - 1.2 mg/dL   GFR calc non Af Amer >60 >60 mL/min   GFR calc Af Amer >60 >60 mL/min    Comment: (NOTE) The eGFR has been calculated using the CKD EPI equation. This calculation has not been validated in all clinical situations. eGFR's persistently <60 mL/min signify possible Chronic Kidney Disease.    Anion gap 6 5 - 15  Ethanol (ETOH)     Status: None    Collection Time: 04/06/15  9:41 AM  Result Value Ref Range   Alcohol, Ethyl (B) <5 <5 mg/dL    Comment:        LOWEST DETECTABLE LIMIT FOR SERUM ALCOHOL IS 5 mg/dL FOR MEDICAL PURPOSES ONLY   Salicylate level     Status: None   Collection Time: 04/06/15  9:41 AM  Result Value Ref  Range   Salicylate Lvl <9.9 2.8 - 30.0 mg/dL  Acetaminophen level     Status: Abnormal   Collection Time: 04/06/15  9:41 AM  Result Value Ref Range   Acetaminophen (Tylenol), Serum <10 (L) 10 - 30 ug/mL    Comment:        THERAPEUTIC CONCENTRATIONS VARY SIGNIFICANTLY. A RANGE OF 10-30 ug/mL MAY BE AN EFFECTIVE CONCENTRATION FOR MANY PATIENTS. HOWEVER, SOME ARE BEST TREATED AT CONCENTRATIONS OUTSIDE THIS RANGE. ACETAMINOPHEN CONCENTRATIONS >150 ug/mL AT 4 HOURS AFTER INGESTION AND >50 ug/mL AT 12 HOURS AFTER INGESTION ARE OFTEN ASSOCIATED WITH TOXIC REACTIONS.   CBC     Status: None   Collection Time: 04/06/15  9:41 AM  Result Value Ref Range   WBC 6.6 3.8 - 10.6 K/uL   RBC 5.10 4.40 - 5.90 MIL/uL   Hemoglobin 14.9 13.0 - 18.0 g/dL   HCT 44.6 40.0 - 52.0 %   MCV 87.3 80.0 - 100.0 fL   MCH 29.2 26.0 - 34.0 pg   MCHC 33.5 32.0 - 36.0 g/dL   RDW 14.1 11.5 - 14.5 %   Platelets 324 150 - 440 K/uL  Urine Drug Screen, Qualitative ([facility] only)     Status: Abnormal   Collection Time: 04/06/15  9:41 AM  Result Value Ref Range   Tricyclic, Ur Screen NONE DETECTED NONE DETECTED   Amphetamines, Ur Screen NONE DETECTED NONE DETECTED   MDMA (Ecstasy)Ur Screen NONE DETECTED NONE DETECTED   Cocaine Metabolite,Ur Tensed NONE DETECTED NONE DETECTED   Opiate, Ur Screen NONE DETECTED NONE DETECTED   Phencyclidine (PCP) Ur S NONE DETECTED NONE DETECTED   Cannabinoid 50 Ng, Ur [place] POSITIVE (A) NONE DETECTED   Barbiturates, Ur Screen NONE DETECTED NONE DETECTED   Benzodiazepine, Ur Scrn NONE DETECTED NONE DETECTED   Methadone Scn, Ur NONE DETECTED NONE DETECTED    Comment: (NOTE) 833  Tricyclics, urine                Cutoff 1000 ng/mL 200  Amphetamines, urine             Cutoff 1000 ng/mL 300  MDMA (Ecstasy), urine           Cutoff 500 ng/mL 400  Cocaine Metabolite, urine       Cutoff 300 ng/mL 500  Opiate, urine                   Cutoff 300 ng/mL 600  Phencyclidine (PCP), urine      Cutoff 25 ng/mL 700  Cannabinoid, urine              Cutoff 50 ng/mL 800  Barbiturates, urine             Cutoff 200 ng/mL 900  Benzodiazepine, urine           Cutoff 200 ng/mL 1000 Methadone, urine                Cutoff 300 ng/mL 1100 1200 The urine drug screen provides only a preliminary, unconfirmed 1300 analytical test result and should not be used for non-medical 1400 purposes. Clinical consideration and professional judgment should 1500 be applied to any positive drug screen result due to possible 1600 interfering substances. A more specific alternate chemical method 1700 must be used in order to obtain a confirmed analytical result.  1800 Gas chromato graphy / mass spectrometry (GC/MS) is the preferred 1900 confirmatory method.     No current facility-administered medications for this encounter.  Current Outpatient Prescriptions  Medication Sig Dispense Refill  . dicyclomine (BENTYL) 10 MG capsule Take 1 capsule (10 mg total) by mouth 4 (four) times daily. 10 capsule 0  . omeprazole (PRILOSEC) 20 MG capsule Take 40 mg by mouth every evening.    . ondansetron (ZOFRAN) 4 MG tablet Take 1 tablet (4 mg total) by mouth daily as needed for nausea or vomiting. 10 tablet 0  . Pseudoephedrine HCl 240 MG TB24 Take 240 mg by mouth daily.    . traZODone (DESYREL) 50 MG tablet Take 100 mg by mouth at bedtime as needed for sleep.      Musculoskeletal: Strength & Muscle Tone: within normal limits Gait & Station: normal Patient leans: N/A  Psychiatric Specialty Exam: Review of Systems  Constitutional: Negative.   HENT: Negative.   Eyes: Negative.   Respiratory: Negative.   Cardiovascular: Negative.    Gastrointestinal: Negative.   Musculoskeletal: Negative.   Skin: Negative.   Neurological: Negative.   Psychiatric/Behavioral: Positive for depression and substance abuse. Negative for suicidal ideas, hallucinations and memory loss. The patient is nervous/anxious and has insomnia.     Blood pressure 145/101, pulse 80, temperature 97.3 F (36.3 C), temperature source Oral, resp. rate 18, height 5' 9"  (1.753 m), weight 88.451 kg (195 lb), SpO2 97 %.Body mass index is 28.78 kg/(m^2).  General Appearance: Casual  Eye Contact::  Good  Speech:  Normal Rate  Volume:  Normal  Mood:  Euthymic  Affect:  Appropriate  Thought Process:  Goal Directed  Orientation:  Full (Time, Place, and Person)  Thought Content:  Negative  Suicidal Thoughts:  No  Homicidal Thoughts:  No  Memory:  Immediate;   Fair Recent;   Fair Remote;   Fair  Judgement:  Intact  Insight:  Fair  Psychomotor Activity:  Normal  Concentration:  Fair  Recall:  [employer] of Knowledge:Fair  Language: Fair  Akathisia:  No  Handed:  Right  AIMS (if indicated):     Assets:  Communication Skills Desire for Improvement Financial Resources/Insurance Housing Physical Health Resilience Social Support  ADL's:  Intact  Cognition: WNL  Sleep:      Treatment Plan Summary: Medication management and Plan 35 year old male with past history of bipolar disorder presents to the hospital. When he first came in he made some statements about suicidal ideation but he had not done anything to try and hurt himself and was not indicating an acute plan. To my interview today the patient says he never had any thought plan or intention of actually trying to harm himself. He is convincing in talking about things in life that he has to live for. His affect is not sad or overwhelmed. He is appropriate in his conversation. There is no sign of psychosis. As far as his bipolar disorder at think his mixed symptoms are under good control at this point. I  have offered the patient to restart a low dose of Geodon 40 mg at night to be taken with food with a 30 day supply as he is telling me that that helped him with his anxiety in the past. Patient understands potential side effects. As far as his marijuana abuse patient has been counseled to long-term use of marijuana can ultimately increase problems with anxiety particularly during withdrawal and encouraged to think about cutting back long-term on that. His acute anxiety and agitation and has essentially resolved. At this point there is no indication for hospital level treatment. I will review the case  with the emergency room physician and recommend that he be referred to a local psychiatric provider for outpatient care.  Disposition: No evidence of imminent risk to self or others at present.   Patient does not meet criteria for psychiatric inpatient admission. Supportive therapy provided about ongoing stressors.  Darryle Dennie 04/06/2015 2:19 PM

## 2015-04-06 NOTE — ED Provider Notes (Signed)
[facility] Emergency Department Provider Note  Time seen: 10:42 AM  I have reviewed the triage vital signs and the nursing notes.   HISTORY  Chief Complaint Suicidal    HPI Jaime Shannon is a 35 y.o. male with a past medical history of bipolar disorder who presents the emergency department with manic symptoms as well as suicidal ideation. According to the patient he has not been taking medications for his bipolar disorder over the past 3 years because he did not like how they made him feel. States over the past 4-5 days patient has not been sleeping, states he cannot stop thinking and that his brain is running wild. He is also been having thoughts of hurting or harming himself other than he states he would never do it. Patient states this happens several times a year but usually not to this degree. Patient currently pacing around the room. Denies any medical complaints.     Past Medical History  Diagnosis Date  . Kidney stone   . Bipolar 1 disorder (HCC)     There are no active problems to display for this patient.   Past Surgical History  Procedure Laterality Date  . Abdominal surgery    . Surgery scrotal / testicular      Current Outpatient Rx  Name  Route  Sig  Dispense  Refill  . EXPIRED: dicyclomine (BENTYL) 10 MG capsule   Oral   Take 1 capsule (10 mg total) by mouth 4 (four) times daily.   10 capsule   0   . omeprazole (PRILOSEC) 20 MG capsule   Oral   Take 40 mg by mouth every evening.         . ondansetron (ZOFRAN) 4 MG tablet   Oral   Take 1 tablet (4 mg total) by mouth daily as needed for nausea or vomiting.   10 tablet   0   . Pseudoephedrine HCl 240 MG TB24   Oral   Take 240 mg by mouth daily.         . traZODone (DESYREL) 50 MG tablet   Oral   Take 100 mg by mouth at bedtime as needed for sleep.           Allergies Promethazine  No family history on file.  Social History Social History  Substance Use  Topics  . Smoking status: Current Every Day Smoker  . Smokeless tobacco: None  . Alcohol Use: Yes    Review of Systems Constitutional: Negative for fever. Cardiovascular: Negative for chest pain. Respiratory: Negative for shortness of breath. Gastrointestinal: Negative for abdominal pain Musculoskeletal: Negative for back pain. Neurological: Negative for headache Psychiatric:Positive for SI, manic type behaviors. 10-point ROS otherwise negative.  ____________________________________________   PHYSICAL EXAM:  VITAL SIGNS: ED Triage Vitals  Enc Vitals Group     BP 04/06/15 0933 145/101 mmHg     Pulse Rate 04/06/15 0933 80     Resp 04/06/15 0933 18     Temp 04/06/15 0933 97.3 F (36.3 C)     Temp Source 04/06/15 0933 Oral     SpO2 04/06/15 0933 97 %     Weight 04/06/15 0933 195 lb (88.451 kg)     Height 04/06/15 0933 5\' 9"  (1.753 m)     Head Cir --      Peak Flow --      Pain Score --      Pain Loc --      Pain Edu? --  Excl. in GC? --     Constitutional: Alert and oriented. Pacing around the room anxiously. Eyes: Normal exam ENT   Head: Normocephalic and atraumatic   Mouth/Throat: Mucous membranes are moist. Cardiovascular: Normal rate, regular rhythm. No murmur Respiratory: Normal respiratory effort without tachypnea nor retractions. Breath sounds are clear and equal bilaterally. No wheezes/rales/rhonchi. Gastrointestinal: Soft and nontender. No distention.   Musculoskeletal: Nontender with normal range of motion in all extremities.  Neurologic:  Normal speech and language. No gross focal neurologic deficits Skin:  Skin is warm, dry and intact.  Psychiatric: States SI, but states he would not act upon it. Anxious appearing, pacing around the room. ____________________________________________    INITIAL IMPRESSION / ASSESSMENT AND PLAN / ED COURSE  Pertinent labs & imaging results that were available during my care of the patient were reviewed by me  and considered in my medical decision making (see chart for details).  Patient presents the emergency department with symptoms of mania including rapid speech, not sleeping, states his brain will not stop thinking. Patient is also having thoughts of harming himself although he states he would never act upon them. Given the patient's symptoms of mania with SI will involuntarily commit the patient to the hospital until he can be adequately evaluated by psychiatry. Currently awaiting lab results.  Labs are within normal limits, pending urine drug screen.  Patient has been seen and evaluated by psychiatry. Dr. Toni Amend has written the patient for 40 mg of Geodon to be taken nightly. Patient denies any SI or HI to psychiatry. They rescinded the IVC and the patient will be discharged home. ____________________________________________   FINAL CLINICAL IMPRESSION(S) / ED DIAGNOSES  Suicidal ideation Mania   Minna Antis, MD 04/06/15 1439

## 2015-04-06 NOTE — ED Notes (Signed)
Pt's wife in to visit. Pt pacing in room.

## 2015-04-06 NOTE — ED Notes (Signed)
TTS at bedside. 

## 2015-04-06 NOTE — ED Notes (Signed)
Patient met with psychiatrist.  Patient calm and cooperative. Currently resting in room, in no apparent distress. Plan is to discharge him home with referral for out patient treatment.  Maintained on 15 minute checks and observation by security camera for safety.

## 2015-04-06 NOTE — ED Notes (Signed)
The patient was dressed out into the required purple scrubs. His wife is going to take all of his belongings with her when she leaves. He was then walked to hallway bed 20. No incidents.

## 2015-04-06 NOTE — ED Notes (Signed)
Patient discharged ambulatory to home, accompanied by wife. He denies SI or HI. Discharge instructions reviewed with staff, he verbalizes understanding. Patient received copy of DC plan and all personal belongings.

## 2015-04-06 NOTE — Discharge Instructions (Signed)
Suicidal Feelings: How to Help Yourself  Suicide is the taking of one's own life. If you feel as though life is getting too tough to handle and are thinking about suicide, get help right away. To get help:   Call your local emergency services (911 in the [place]   Call a suicide hotline to speak with a trained counselor who understands how you are feeling. The following is a list of suicide hotlines in the [place]. For a list of hotlines in [place], visit www.suicide.org/hotlines/international/[place]-suicide-hotlines.html.    1-800-273-TALK (1-800-273-8255).    1-800-SUICIDE (1-800-784-2433).    1-888-628-9454. This is a hotline for Spanish speakers.    1-800-799-4TTY (1-800-799-4889). This is a hotline for TTY users.    1-866-4-U-TREVOR (1-866-488-7386). This is a hotline for lesbian, gay, bisexual, transgender, or questioning youth.   Contact a crisis center or a local suicide prevention center. To find a crisis center or suicide prevention center:    Call your local hospital, clinic, community service organization, mental health center, social service provider, or health department. Ask for assistance in connecting to a crisis center.    Visit www.suicidepreventionlifeline.org/getinvolved/locator for a list of crisis centers in the [place], or visit www.suicideprevention.[place]/thinking-about-suicide/find-a-crisis-centre for a list of centers in [place].   Visit the following websites:    National Suicide Prevention Lifeline: www.suicidepreventionlifeline.org    Hopeline: www.hopeline.com    American Foundation for Suicide Prevention: www.afsp.org    The Trevor Project (for lesbian, gay, bisexual, transgender, or questioning youth): www.thetrevorproject.org  HOW CAN I HELP MYSELF FEEL BETTER?   Promise yourself that you will not do anything drastic when you have suicidal feelings. Remember, there is hope. Many people have gotten through suicidal thoughts and feelings, and you will, too. You may  have gotten through them before, and this proves that you can get through them again.   Let family, friends, teachers, or counselors know how you are feeling. Try not to isolate yourself from those who care about you. Remember, they will want to help you. Talk with someone every day, even if you do not feel sociable. Face-to-face conversation is best.   Call a mental health professional and see one regularly.   Visit your primary health care provider every year.   Eat a well-balanced diet, and space your meals so you eat regularly.   Get plenty of rest.   Avoid alcohol and drugs, and remove them from your home. They will only make you feel worse.   If you are thinking of taking a lot of medicine, give your medicine to someone who can give it to you one day at a time. If you are on antidepressants and are concerned you will overdose, let your health care provider know so he or she can give you safer medicines. Ask your mental health professional about the possible side effects of any medicines you are taking.   Remove weapons, poisons, knives, and anything else that could harm you from your home.   Try to stick to routines. Follow a schedule every day. Put self-care on your schedule.   Make a list of realistic goals, and cross them off when you achieve them. Accomplishments give a sense of worth.   Wait until you are feeling better before doing the things you find difficult or unpleasant.   Exercise if you are able. You will feel better if you exercise for even a half hour each day.   Go out in the sun or into nature. This will help you   recover from depression faster. If you have a favorite place to walk, go there.   Do the things that have always given you pleasure. Play your favorite music, read a good book, paint a picture, play your favorite instrument, or do anything else that takes your mind off your depression if it is safe to do.   Keep your living space well lit.   When you are feeling well,  write yourself a letter about tips and support that you can read when you are not feeling well.   Remember that life's difficulties can be sorted out with help. Conditions can be treated. You can work on thoughts and strategies that serve you well.     This information is not intended to replace advice given to you by your health care provider. Make sure you discuss any questions you have with your health care provider.     Document Released: 09/23/2002 Document Revised: 04/09/2014 Document Reviewed: 07/14/2013  Elsevier Interactive Patient Education 2016 Elsevier Inc.  Bipolar Disorder  Bipolar disorder is a mental illness. The term bipolar disorder actually is used to describe a group of disorders that all share varying degrees of emotional highs and lows that can interfere with daily functioning, such as work, school, or relationships. Bipolar disorder also can lead to drug abuse, hospitalization, and suicide.  The emotional highs of bipolar disorder are periods of elation or irritability and high energy. These highs can range from a mild form (hypomania) to a severe form (mania). People experiencing episodes of hypomania may appear energetic, excitable, and highly productive. People experiencing mania may behave impulsively or erratically. They often make poor decisions. They may have difficulty sleeping. The most severe episodes of mania can involve having very distorted beliefs or perceptions about the world and seeing or hearing things that are not real (psychotic delusions and hallucinations).   The emotional lows of bipolar disorder (depression) also can range from mild to severe. Severe episodes of bipolar depression can involve psychotic delusions and hallucinations.  Sometimes people with bipolar disorder experience a state of mixed mood. Symptoms of hypomania or mania and depression are both present during this mixed-mood episode.  SIGNS AND SYMPTOMS  There are signs and symptoms of the episodes of  hypomania and mania as well as the episodes of depression. The signs and symptoms of hypomania and mania are similar but vary in severity. They include:   Inflated self-esteem or feeling of increased self-confidence.   Decreased need for sleep.   Unusual talkativeness (rapid or pressured speech) or the feeling of a need to keep talking.   Sensation of racing thoughts or constant talking, with quick shifts between topics that may or may not be related (flight of ideas).   Decreased ability to focus or concentrate.   Increased purposeful activity, such as work, studies, or social activity, or nonproductive activity, such as pacing, squirming and fidgeting, or finger and toe tapping.   Impulsive behavior and use of poor judgment, resulting in high-risk activities, such as having unprotected sex or spending excessive amounts of money.  Signs and symptoms of depression include the following:    Feelings of sadness, hopelessness, or helplessness.   Frequent or uncontrollable episodes of crying.   Lack of feeling anything or caring about anything.   Difficulty sleeping or sleeping too much.   Inability to enjoy the things you used to enjoy.    Desire to be alone all the time.    Feelings of guilt or worthlessness.     without depressive episodes. People with type II bipolar disorder have hypomanic episodes and major depressive episodes, which are more serious than mild depression. The type of bipolar disorder you have can make an important  difference in how your illness is monitored and treated. Your caregiver may ask questions about your medical history and use of alcohol or drugs, including prescription medication. Certain medical conditions and substances also can cause emotional highs and lows that resemble bipolar disorder (secondary bipolar disorder).  TREATMENT  Bipolar disorder is a long-term illness. It is best controlled with continuous treatment rather than treatment only when symptoms occur. The following treatments can be prescribed for bipolar disorders:  Medication--Medication can be prescribed by a doctor that is an expert in treating mental disorders (psychiatrists). Medications called mood stabilizers are usually prescribed to help control the illness. Other medications are sometimes added if symptoms of mania, depression, or psychotic delusions and hallucinations occur despite the use of a mood stabilizer.  Talk therapy--Some forms of talk therapy are helpful in providing support, education, and guidance. A combination of medication and talk therapy is best for managing the disorder over time. A procedure in which electricity is applied to your brain through your scalp (electroconvulsive therapy) is used in cases of severe mania when medication and talk therapy do not work or work too slowly.   This information is not intended to replace advice given to you by your health care provider. Make sure you discuss any questions you have with your health care provider.   Document Released: 06/25/2000 Document Revised: 04/09/2014 Document Reviewed: 04/14/2012 Elsevier Interactive Patient Education [employer] Elsevier Inc.

## 2015-04-06 NOTE — ED Notes (Signed)

## 2015-04-06 NOTE — ED Notes (Signed)
Pt presents with sadness, hopelessness and suicidal thoughts for two weeks now. Pt with hx of bipolar and has taken his meds in three years because he did not like the way it made him feel.  Pt is voluntary at this time. Pt with on and off suicidal thoughts, denies any HI.

## 2015-04-11 ENCOUNTER — Other Ambulatory Visit: Payer: Self-pay | Admitting: Psychiatry

## 2015-04-11 MED ORDER — QUETIAPINE FUMARATE 100 MG PO TABS: 100.0000 mg | ORAL_TABLET | Freq: Every day | ORAL | Status: DC

## 2015-04-11 NOTE — Progress Notes (Signed)
This patient who was seen on January 4 in the emergency room called back to the hospital stating that he still was completely unable to sleep and feeling very anxious. Patient denied having any suicidal or homicidal thoughts. He was able to articulate his concerns lucidly and participate in a reasonable conversation. Patient had been taking the 40 mg Geodon that I had prescribed on January 4 without any benefit. He is requesting some assistance with his sleep. [facility] but says it will be several weeks before he can be seen. I have agreed to phone in a prescription for Seroquel 100 mg at night for sleep and a diagnosis of bipolar disorder. Patient is agreeable to the plan. He is advised that if he still can't sleep or if other symptoms are worsening and he needs to be seen sooner he can go for an emergency [facility] during their walk-in hours or he can always come back to the emergency room. Patient agrees to the plan. Prescription telephoned in to the [facility] [place]

## 2015-04-23 ENCOUNTER — Encounter: Payer: Self-pay | Admitting: Emergency Medicine

## 2015-04-23 ENCOUNTER — Emergency Department
Admission: EM | Admit: 2015-04-23 | Discharge: 2015-04-25 | Disposition: A | Discharge status: Other Acute Inpatient Hospital | Payer: 59 | Attending: Emergency Medicine | Admitting: Emergency Medicine

## 2015-04-23 DIAGNOSIS — F1210 Cannabis abuse, uncomplicated: Secondary | ICD-10-CM | POA: Insufficient documentation

## 2015-04-23 DIAGNOSIS — F309 Manic episode, unspecified: Secondary | ICD-10-CM

## 2015-04-23 DIAGNOSIS — F17200 Nicotine dependence, unspecified, uncomplicated: Secondary | ICD-10-CM | POA: Insufficient documentation

## 2015-04-23 DIAGNOSIS — Z79899 Other long term (current) drug therapy: Secondary | ICD-10-CM | POA: Insufficient documentation

## 2015-04-23 LAB — URINE DRUG SCREEN, QUALITATIVE (ARMC ONLY)
Amphetamines, Ur Screen: NOT DETECTED
BARBITURATES, UR SCREEN: NOT DETECTED
Benzodiazepine, Ur Scrn: NOT DETECTED
COCAINE METABOLITE, UR ~~LOC~~: NOT DETECTED
Cannabinoid 50 Ng, Ur ~~LOC~~: POSITIVE — AB
MDMA (ECSTASY) UR SCREEN: NOT DETECTED
METHADONE SCREEN, URINE: NOT DETECTED
Opiate, Ur Screen: NOT DETECTED
Phencyclidine (PCP) Ur S: NOT DETECTED
TRICYCLIC, UR SCREEN: NOT DETECTED

## 2015-04-23 LAB — COMPREHENSIVE METABOLIC PANEL
ALT: 20 U/L (ref 17–63)
ANION GAP: 9 (ref 5–15)
AST: 16 U/L (ref 15–41)
Albumin: 4.5 g/dL (ref 3.5–5.0)
Alkaline Phosphatase: 67 U/L (ref 38–126)
BUN: 13 mg/dL (ref 6–20)
CHLORIDE: 102 mmol/L (ref 101–111)
CO2: 31 mmol/L (ref 22–32)
Calcium: 10.3 mg/dL (ref 8.9–10.3)
Creatinine, Ser: 0.83 mg/dL (ref 0.61–1.24)
GFR calc non Af Amer: >60 mL/min (ref >60)
Glucose, Bld: 148 mg/dL — ABNORMAL HIGH (ref 65–99)
Potassium: 4 mmol/L (ref 3.5–5.1)
SODIUM: 142 mmol/L (ref 135–145)
Total Bilirubin: 0.7 mg/dL (ref 0.3–1.2)
Total Protein: 7.7 g/dL (ref 6.5–8.1)

## 2015-04-23 LAB — CBC
HCT: 46.5 % (ref 40.0–52.0)
HEMOGLOBIN: 15.5 g/dL (ref 13.0–18.0)
MCH: 29.1 pg (ref 26.0–34.0)
MCHC: 33.3 g/dL (ref 32.0–36.0)
MCV: 87.4 fL (ref 80.0–100.0)
Platelets: 456 10*3/uL — ABNORMAL HIGH (ref 150–440)
RBC: 5.32 MIL/uL (ref 4.40–5.90)
RDW: 13.8 % (ref 11.5–14.5)
WBC: 10.2 10*3/uL (ref 3.8–10.6)

## 2015-04-23 LAB — SALICYLATE LEVEL

## 2015-04-23 LAB — ACETAMINOPHEN LEVEL

## 2015-04-23 LAB — TSH: TSH: 0.685 u[IU]/mL (ref 0.350–4.500)

## 2015-04-23 LAB — ETHANOL: Alcohol, Ethyl (B): <5 mg/dL (ref <5)

## 2015-04-23 LAB — TROPONIN I: Troponin I: <0.03 ng/mL (ref <0.031)

## 2015-04-23 MED ORDER — LORAZEPAM 2 MG PO TABS
2.0000 mg | ORAL_TABLET | Freq: Once | ORAL | Status: AC
  Administered 2015-04-23: 2 mg via ORAL
  Filled 2015-04-23: qty 1

## 2015-04-23 NOTE — ED Notes (Signed)
Hx bipolar.  Pt reports his family wants to involuntarily commit him so he came for evaluation today on own.  When asked about SI/HI he states " I will hurt someone if they bring me in against my will".  Pt asked why he is here today and why family wants to commit him.  He states " i have problems.  They didn't have evidence to have restraining order" pt will no answer why he is here or why his family wants him committed.  Also admits to PTSD, anxiety.

## 2015-04-23 NOTE — BH Assessment (Signed)
Assessment Note  Jaime Shannon is an 35 y.o. male. Pt was oriented and presented as ambivalent, somewhat irritable and reticent throughout assessment. Therefore, limiting information obtain by clinician. In regards to presenting problem, pt stated "I'm supposed to be here because I'm crazy. That's what my family says, that's what you say". Pt reports that he is married and that his wife wanted him to be assessed. Pt states that he presented to ED today so that his family would not attempt IVC on Monday. Pt stated "I don't have anywhere to go" but, responded "I don't know" when writer asked if he was homeless.   When attempting to assess for current SI, pt stated "Not today" and "I just want to do what I'm supposed to do". Pt would not provide additional details and instructed clinician to refer to his chart for additional information regarding inpatient admissions and any previous attempts. In regards to HI, pt responded "I don't know, depends on how I'm treating I guess". Pt reported no h/o abuse and no difficulties performing ADLS. Pt denied THC use stating that he has not used cannabis in 11 days. Pt reported no other SA hx.  Per pt chart:   Hx bipolar.  Pt reports his family wants to involuntarily commit him so he came for evaluation today on own.  When asked about SI/HI he states " I will hurt someone if they bring me in against my will".  Pt asked why he is here today and why family wants to commit him.  He states " i have problems.  They didn't have evidence to have restraining order" pt will no answer why he is here or why his family wants him committed.  Also admits to PTSD, anxiety.   Diagnosis: Bipolar d/o  Past Medical History:  Past Medical History  Diagnosis Date  . Kidney stone   . Bipolar 1 disorder [facility])     Past Surgical History  Procedure Laterality Date  . Abdominal surgery    . Surgery scrotal / testicular      Family History: History reviewed. No pertinent family  history.  Social History:  reports that he has been smoking.  He does not have any smokeless tobacco history on file. He reports that he drinks alcohol. He reports that he uses illicit drugs (Marijuana).  Additional Social History:  Alcohol / Drug Use Pain Medications: Denies  Prescriptions: Denies  Over the Counter: Denies  Longest period of sobriety (when/how long): unknown  Substance #1 Name of Substance 1: Marijauna  1 - Age of First Use: 35 y.o. 1 - Amount (size/oz): 4-5 grams/day 1 - Frequency: daily  1 - Duration: 10 years  1 - Last Use / Amount: 10 days ago/ "a lot"  CIWA: CIWA-Ar BP: (!) 142/87 mmHg Pulse Rate: (!) 102 COWS:    Allergies:  Allergies  Allergen Reactions  . Promethazine     Home Medications:  (Not in a hospital admission)  OB/GYN Status:  No LMP for male patient.  General Assessment Data Location of Assessment: [facility] ED TTS Assessment: In system Is this a Tele or Face-to-Face Assessment?: Face-to-Face Is this an Initial Assessment or a Re-assessment for this encounter?: Initial Assessment Marital status: Married Is patient pregnant?: No Pregnancy Status: No Living Arrangements: Spouse/significant other, Other (Comment) (Possibly Homeless) Can pt return to current living arrangement?:  (Unknown) Admission Status: Voluntary Is patient capable of signing voluntary admission?: Yes Referral Source: Self/Family/Friend Insurance type: [facility]  Medical Screening Exam [facility] Walk-in  ONLY) Medical Exam completed: Yes  Crisis Care Plan Living Arrangements: Spouse/significant other, Other (Comment) (Possibly Homeless) Name of Psychiatrist: None Name of Therapist: None  Education Status Is patient currently in school?: No Current Grade:  (Per Chart) Highest grade of school patient has completed: [employer] Name of school: NA Contact person: NA  Risk to self with the past 6 months Suicidal Ideation: No ("Not today" ) Has patient been a risk to  self within the past 6 months prior to admission? : Yes (Per Chart) Suicidal Intent: No Has patient had any suicidal intent within the past 6 months prior to admission? : No (Per Chart) Is patient at risk for suicide?:  (Unable to properly assess do to pt reticence) Suicidal Plan?: No (None Disclosed) Has patient had any suicidal plan within the past 6 months prior to admission? : No (Per Chart) What has been your use of drugs/alcohol within the last 12 months?: Cannabis Use (Per Chart) Previous Attempts/Gestures: No (Per Chart) How many times?: 0 Other Self Harm Risks: Unable to properly assess do to pt reticence Intentional Self Injurious Behavior: None (Per Chart) Family Suicide History: No (Per Chary) Persecutory voices/beliefs?: No (None reported) Depression:  (Unable to properly assess do to pt reticence) Substance abuse history and/or treatment for substance abuse?: Yes (Per Chart) Suicide prevention information given to non-admitted patients: Not applicable  Risk to Others within the past 6 months Homicidal Ideation:  ("I don't know, depends on how I'm treating I guess". ) Does patient have any lifetime risk of violence toward others beyond the six months prior to admission? : No (Per chart) Thoughts of Harm to Others: No (Per Chart) Current Homicidal Intent: No (None Reported) Current Homicidal Plan: No (None Reported) Access to Homicidal Means:  ("I don't know, depends on how I'm treating I guess". ) Identified Victim: None Identified History of harm to others?: No (Per chart) Assessment of Violence: None Noted Violent Behavior Description: no Does patient have access to weapons?:  (UTA due to pt reticence) Criminal Charges Pending?:  (UTA due to pt reticence) Does patient have a court date:  (UTA due to pt reticence) Is patient on probation?: Unknown  Psychosis Hallucinations: Auditory (Per Chart) Delusions:  (UTA due to pt reticence)  Mental Status  Report Appearance/Hygiene: In scrubs Eye Contact: Fair Motor Activity: Unremarkable Speech: Logical/coherent Level of Consciousness: Alert, Irritable Mood: Irritable Affect: Irritable Anxiety Level: Minimal Thought Processes: Coherent, Thought Blocking, Relevant Judgement: Unable to Assess Orientation: Person, Place, Time, Situation, Appropriate for developmental age Obsessive Compulsive Thoughts/Behaviors: None  Cognitive Functioning Concentration: Normal Memory: Recent Intact, Remote Intact IQ: Average Insight: Poor Impulse Control: Poor Appetite:  ("Varies") Weight Loss: 0 Weight Gain: 0 Sleep: No Change Total Hours of Sleep:  ("Varies") Vegetative Symptoms: Unable to Assess  ADLScreening [facility] Assessment Services) Patient's cognitive ability adequate to safely complete daily activities?: Yes Patient able to express need for assistance with ADLs?: Yes Independently performs ADLs?: Yes (appropriate for developmental age)  Prior Inpatient Therapy Prior Inpatient Therapy: Yes (Per Chart) Prior Therapy Dates:  (Not Provided) Prior Therapy Facilty/Provider(s): [facility] Reason for Treatment: Depression  Prior Outpatient Therapy Prior Outpatient Therapy: No Does patient have an ACCT team?: No Does patient have Intensive In-House Services?  : No Does patient have [facility] services? : No Does patient have P4CC services?: No  ADL Screening (condition at time of admission) Patient's cognitive ability adequate to safely complete daily activities?: Yes Is the patient deaf or have difficulty hearing?: No Does the  patient have difficulty seeing, even when wearing glasses/contacts?: No Does the patient have difficulty concentrating, remembering, or making decisions?: Yes Patient able to express need for assistance with ADLs?: Yes Does the patient have difficulty dressing or bathing?: No Independently performs ADLs?: Yes (appropriate for developmental age) Does the patient have  difficulty walking or climbing stairs?: No Weakness of Legs: None Weakness of Arms/Hands: None  Home Assistive Devices/Equipment Home Assistive Devices/Equipment: None  Therapy Consults (therapy consults require a physician order) PT Evaluation Needed: No OT Evalulation Needed: No SLP Evaluation Needed: No Abuse/Neglect Assessment (Assessment to be complete while patient is alone) Physical Abuse: Denies Verbal Abuse: Denies Sexual Abuse: Denies Exploitation of patient/patient's resources: Denies Self-Neglect: Denies Values / Beliefs Cultural Requests During Hospitalization: None Spiritual Requests During Hospitalization: None Consults Spiritual Care Consult Needed: No Social Work Consult Needed: No [employer]chant [employer] officer (For Healthcare) Does patient have an advance directive?: No Would patient like information on creating an advanced directive?: No - patient declined information    Additional Information 1:1 In Past 12 Months?: No CIRT Risk: No Elopement Risk: No Does patient have medical clearance?: No     Disposition:  Disposition Initial Assessment Completed for this Encounter: Yes Disposition of Patient: Other dispositions (Psych MD Consult)  On Site Evaluation by:   Reviewed with Physician:    Jaime Shannon 04/23/2015 8:42 PM

## 2015-04-23 NOTE — ED Provider Notes (Signed)
[facility] Emergency Department Provider Note  ____________________________________________  Time seen: Approximately 10:29 PM  I have reviewed the triage vital signs and the nursing notes.   HISTORY  Chief Complaint Psychiatric Evaluation   HPI Jaime Shannon is a 35 y.o. male who reports he came in because his wife was, commit him tomorrow. Reports he's been to different people some days he is running around nonstop wide open and the other minute and the rest of time his not. He has been spending money he doesn't have finding many lottery tickets he said he bought $4 with a lottery tickets 1 $50 and spent that $50 on water tickets which she lost all of them. He has not been sleeping much only a few hours a night most nights of the week. He says he has not been able to be responsible for his decisions. When I asked him about suicidality patient says he not right at the minute. I have been when I got like this before. He will not be more specific than that. Denies any suicidal ideation occurring earlier today. Patient has a history of bipolar illness with mania. Patient reports she is not taking his medicines because they don't work.   Past Medical History  Diagnosis Date  . Kidney stone   . Bipolar 1 disorder [facility])     Patient Active Problem List   Diagnosis Date Noted  . Bipolar disorder, mixed (HCC) 04/06/2015  . Acute anxiety 04/06/2015  . Marijuana abuse 04/06/2015    Past Surgical History  Procedure Laterality Date  . Abdominal surgery    . Surgery scrotal / testicular      Current Outpatient Rx  Name  Route  Sig  Dispense  Refill  . QUEtiapine (SEROQUEL) 100 MG tablet   Oral   Take 1 tablet (100 mg total) by mouth at bedtime.   30 tablet   0   . EXPIRED: dicyclomine (BENTYL) 10 MG capsule   Oral   Take 1 capsule (10 mg total) by mouth 4 (four) times daily. Patient not taking: Reported on 04/23/2015   10 capsule   0   . ondansetron  (ZOFRAN) 4 MG tablet   Oral   Take 1 tablet (4 mg total) by mouth daily as needed for nausea or vomiting. Patient not taking: Reported on 04/23/2015   10 tablet   0   . ziprasidone (GEODON) 40 MG capsule   Oral   Take 1 capsule (40 mg total) by mouth daily with supper. Patient not taking: Reported on 04/23/2015   30 capsule   0     Allergies Promethazine  History reviewed. No pertinent family history.  Social History Social History  Substance Use Topics  . Smoking status: Current Every Day Smoker  . Smokeless tobacco: None  . Alcohol Use: Yes    Review of Systems Constitutional: No fever/chills Eyes: No visual changes. ENT: No sore throat. Cardiovascular: Denies chest pain. Respiratory: Denies shortness of breath. Gastrointestinal: No abdominal pain.  No nausea, no vomiting.  No diarrhea.  No constipation. Genitourinary: Negative for dysuria. Musculoskeletal: Negative for back pain. Skin: Negative for rash. Neurological: Negative for headaches, focal weakness or numbness.  10-point ROS otherwise negative.  ____________________________________________   PHYSICAL EXAM:  VITAL SIGNS: ED Triage Vitals  Enc Vitals Group     BP 04/23/15 1724 160/101 mmHg     Pulse Rate 04/23/15 1724 128     Resp 04/23/15 1724 16     Temp  04/23/15 1724 98.4 F (36.9 C)     Temp Source 04/23/15 1724 Oral     SpO2 04/23/15 1724 99 %     Weight 04/23/15 1724 195 lb (88.451 kg)     Height 04/23/15 1724  (1.753 m)     Head Cir --      Peak Flow --      Pain Score --      Pain Loc --      Pain Edu? --      Excl. in GC? --     Constitutional: Alert and oriented. Well appearing and in no acute distress. Eyes: Conjunctivae are normal. PERRL. EOMI. Head: Atraumatic. Nose: No congestion/rhinnorhea. Mouth/Throat: Mucous membranes are moist.  Oropharynx non-erythematous. Neck: No stridor.  Cardiovascular: Normal rate, regular rhythm. Grossly normal heart sounds.  Good  peripheral circulation. Respiratory: Normal respiratory effort.  No retractions. Lungs CTAB. Gastrointestinal: Soft and nontender. No distention. No abdominal bruits. No CVA tenderness. Musculoskeletal: No lower extremity tenderness nor edema.  No joint effusions. Neurologic:  Normal speech and language. No gross focal neurologic deficits are appreciated. No gait instability. Skin:  Skin is warm, dry and intact. No rash noted.   ____________________________________________   LABS (all labs ordered are listed, but only abnormal results are displayed)  Labs Reviewed  COMPREHENSIVE METABOLIC PANEL - Abnormal; Notable for the following:    Glucose, Bld 148 (*)    All other components within normal limits  ACETAMINOPHEN LEVEL - Abnormal; Notable for the following:    Acetaminophen (Tylenol), Serum <10 (*)    All other components within normal limits  CBC - Abnormal; Notable for the following:    Platelets 456 (*)    All other components within normal limits  URINE DRUG SCREEN, QUALITATIVE ([facility] ONLY) - Abnormal; Notable for the following:    Cannabinoid 50 Ng, Ur [place] POSITIVE (*)    All other components within normal limits  ETHANOL  SALICYLATE LEVEL  TROPONIN I  TSH   ____________________________________________  EKG  EKG shows tiny sinus tachycardia rate of 127 normal axis no acute ST-T wave changes computer is reading possible left atrial enlargement which may be correct. After waiting in the lobby on the monitor for some time patient becomes normal sinus rhythm. ____________________________________________  RADIOLOGY   ____________________________________________   PROCEDURES    ____________________________________________   INITIAL IMPRESSION / ASSESSMENT AND PLAN / ED COURSE  Pertinent labs & imaging results that were available during my care of the patient were reviewed by me and considered in my medical decision making (see chart for  details).   ____________________________________________   FINAL CLINICAL IMPRESSION(S) / ED DIAGNOSES  Final diagnoses:  Mania (HCC)      Arnaldo Natal, MD 04/23/15 2233

## 2015-04-23 NOTE — ED Notes (Signed)
BEHAVIORAL HEALTH ROUNDING  Patient sleeping: No.  Patient alert and oriented: yes  Behavior appropriate: Yes. ; If no, describe:  Nutrition and fluids offered: Yes  Toileting and hygiene offered: Yes  Sitter present: not applicable, Q 15 min safety rounds and observation.  Law enforcement present: Yes ODS  

## 2015-04-23 NOTE — ED Notes (Signed)
Vital signs obtained for transfer to ED BHU and pt noted to be pacing and anxious. [id number] noted and Dr Darnelle Catalan informed. Orders received for ativan and per Dr Darnelle Catalan ok to move to [facility].

## 2015-04-23 NOTE — ED Notes (Signed)

## 2015-04-23 NOTE — ED Notes (Signed)
Pt moved to Rm 22. Care assumed of pt. Pt calm and cooperative at this time.   ENVIRONMENTAL ASSESSMENT  Potentially harmful objects out of patient reach: Yes.  Personal belongings secured: Yes.  Patient dressed in hospital provided attire only: Yes.  Plastic bags out of patient reach: Yes.  Patient care equipment (cords, cables, call bells, lines, and drains) shortened, removed, or accounted for: Yes.  Equipment and supplies removed from bottom of stretcher: Yes.  Potentially toxic materials out of patient reach: Yes.  Sharps container removed or out of patient reach: Yes.   BEHAVIORAL HEALTH ROUNDING  Patient sleeping: No.  Patient alert and oriented: yes  Behavior appropriate: Yes. ; If no, describe:  Nutrition and fluids offered: Yes  Toileting and hygiene offered: Yes  Sitter present: not applicable, Q 15 min safety rounds and observation. Law enforcement present: Yes ODS

## 2015-04-23 NOTE — ED Notes (Signed)
Report called to Margaret, RN in ED BHU 

## 2015-04-24 MED ORDER — LORAZEPAM 1 MG PO TABS
ORAL_TABLET | ORAL | Status: AC
  Administered 2015-04-24: 2 mg via ORAL
  Filled 2015-04-24: qty 2

## 2015-04-24 MED ORDER — GABAPENTIN 300 MG PO CAPS
ORAL_CAPSULE | ORAL | Status: AC
  Administered 2015-04-24: 300 mg
  Filled 2015-04-24: qty 1

## 2015-04-24 MED ORDER — GABAPENTIN 600 MG PO TABS
300.0000 mg | ORAL_TABLET | Freq: Three times a day (TID) | ORAL | Status: DC
  Administered 2015-04-24 – 2015-04-25 (×3): 300 mg via ORAL
  Filled 2015-04-24: qty 2

## 2015-04-24 MED ORDER — LORAZEPAM 2 MG PO TABS
2.0000 mg | ORAL_TABLET | Freq: Once | ORAL | Status: AC
  Administered 2015-04-24: 2 mg via ORAL

## 2015-04-24 MED ORDER — QUETIAPINE FUMARATE 25 MG PO TABS
100.0000 mg | ORAL_TABLET | Freq: Once | ORAL | Status: AC
  Administered 2015-04-24: 100 mg via ORAL

## 2015-04-24 MED ORDER — LORAZEPAM 2 MG PO TABS: 2.0000 mg | ORAL_TABLET | Freq: Once | ORAL | Status: DC

## 2015-04-24 MED ORDER — QUETIAPINE FUMARATE 200 MG PO TABS
ORAL_TABLET | ORAL | Status: AC
  Filled 2015-04-24: qty 1

## 2015-04-24 NOTE — ED Notes (Signed)
Patient calmer, able to return to room. Will continue to monitor clinical status.Maintained on 15 minute checks and observation by security camera for safety.

## 2015-04-24 NOTE — ED Notes (Signed)
Patient took a shower. Patient has been calm and cooperative with staff. He is anxious about family and commitment for treatment but was responsive to reassurance. Continue all safety precautions.

## 2015-04-24 NOTE — ED Notes (Signed)
Patient resting quietly in room. No noted distress or abnormal behaviors noted. Will continue 15 minute checks and observation by security camera for safety. 

## 2015-04-24 NOTE — ED Notes (Signed)
Patient in dayroom.  Maintained on 15 minute checks and observation by security camera for safety.

## 2015-04-24 NOTE — ED Notes (Signed)
Snack and beverage given. 

## 2015-04-24 NOTE — ED Notes (Signed)

## 2015-04-24 NOTE — ED Notes (Signed)
Pt. Noted in room; presents with being anxious; restless; constantly moving; states, "I don't sleep much; I.m not home on my Temperpedic.". No complaints or concerns voiced. No distress or abnormal behavior noted. Will continue to monitor with security cameras. Q 15 minute rounds continue.

## 2015-04-24 NOTE — ED Notes (Signed)
Meal served. Patient still restless. MD to see patient now. Maintained on 15 minute checks and observation by security camera for safety.

## 2015-04-24 NOTE — ED Notes (Signed)
Patient asleep in room. No noted distress or abnormal behavior. Will continue 15 minute checks and observation by security cameras for safety. 

## 2015-04-24 NOTE — ED Notes (Signed)
Patient awake, restless, with persistent negative thinking. Continues to blame his family for not understanding his "illness" and not being supportive.  Waiting to see psychiatrist. Maintained on 15 minute checks and observation by security camera for safety.

## 2015-04-24 NOTE — Consult Note (Signed)
[facility]ry Consult   Reason for Consult:  Follow up Referring Physician:  ER Patient Identification: Jaime Shannon MRN:  [id number] Principal Diagnosis: <principal problem not specified> Diagnosis:   Patient Active Problem List   Diagnosis Date Noted  . Bipolar disorder, mixed ([place]) [F31.60] 04/06/2015  . Acute anxiety [F41.9] 04/06/2015  . Marijuana abuse [F12.10] 04/06/2015    Total Time spent with patient: 1 hour  Subjective:   Jaime Shannon is a 35 y.o. male patient admitted with hypomanic and came here himself for help.Marland Kitchen  HPI:  Started having racing thoughts and ideas that are too fast and is not able to focus and feels crazy and paranoid about family who do not understand him.  Past Psychiatric History: Long H/O Bi-polar disorder and was prescribed Seroquel 2 wks ago and does not like it and does not want to take it and took it for 2 days and then stopped and feels better off dead.  Risk to Self: Suicidal Ideation: No ("Not today" ) Suicidal Intent: No Is patient at risk for suicide?:  (Unable to properly assess do to pt reticence) Suicidal Plan?: No (None Disclosed) What has been your use of drugs/alcohol within the last 12 months?: Cannabis Use (Per Chart) How many times?: 0 Other Self Harm Risks: Unable to properly assess do to pt reticence Intentional Self Injurious Behavior: None (Per Chart) Risk to Others: Homicidal Ideation:  ("I don't know, depends on how I'm treating I guess". ) Thoughts of Harm to Others: No (Per Chart) Current Homicidal Intent: No (None Reported) Current Homicidal Plan: No (None Reported) Access to Homicidal Means:  ("I don't know, depends on how I'm treating I guess". ) Identified Victim: None Identified History of harm to others?: No (Per chart) Assessment of Violence: None Noted Violent Behavior Description: no Does patient have access to weapons?:  (UTA due to pt reticence) Criminal Charges Pending?:  (UTA due to pt  reticence) Does patient have a court date:  (UTA due to pt reticence) Prior Inpatient Therapy: Prior Inpatient Therapy: Yes (Per Chart) Prior Therapy Dates:  (Not Provided) Prior Therapy Facilty/Provider(s): [facility] Reason for Treatment: Depression Prior Outpatient Therapy: Prior Outpatient Therapy: No Does patient have an ACCT team?: No Does patient have Intensive In-House Services?  : No Does patient have [facility] services? : No Does patient have P4CC services?: No  Past Medical History:  Past Medical History  Diagnosis Date  . Kidney stone   . Bipolar 1 disorder [facility])     Past Surgical History  Procedure Laterality Date  . Abdominal surgery    . Surgery scrotal / testicular     Family History: History reviewed. No pertinent family history. Family Psychiatric  History: Depression runs in family Social History:  History  Alcohol Use  . Yes     History  Drug Use  . Yes  . Special: Marijuana    Comment: every day for two weeks    Social History   Social History  . Marital Status: Married    Spouse Name: N/A  . Number of Children: N/A  . Years of Education: N/A   Social History Main Topics  . Smoking status: Current Every Day Smoker  . Smokeless tobacco: None  . Alcohol Use: Yes  . Drug Use: Yes    Special: Marijuana     Comment: every day for two weeks  . Sexual Activity: Not Asked   Other Topics Concern  . None   Social History Narrative  Additional Social History:    Pain Medications: Denies  Prescriptions: Denies  Over the Counter: Denies  Longest period of sobriety (when/how long): unknown  Name of Substance 1: Marijauna  1 - Age of First Use: 35 y.o. 1 - Amount (size/oz): 4-5 grams/day 1 - Frequency: daily  1 - Duration: 10 years  1 - Last Use / Amount: 10 days ago/ "a lot"                   Allergies:   Allergies  Allergen Reactions  . Promethazine     Labs:  Results for orders placed or performed during the hospital encounter of  04/23/15 (from the past 48 hour(s))  Comprehensive metabolic panel     Status: Abnormal   Collection Time: 04/23/15  5:43 PM  Result Value Ref Range   Sodium 142 135 - 145 mmol/L   Potassium 4.0 3.5 - 5.1 mmol/L   Chloride 102 101 - 111 mmol/L   CO2 31 22 - 32 mmol/L   Glucose, Bld 148 (H) 65 - 99 mg/dL   BUN 13 6 - 20 mg/dL   Creatinine, Ser 0.83 0.61 - 1.24 mg/dL   Calcium 10.3 8.9 - 10.3 mg/dL   Total Protein 7.7 6.5 - 8.1 g/dL   Albumin 4.5 3.5 - 5.0 g/dL   AST 16 15 - 41 U/L   ALT 20 17 - 63 U/L   Alkaline Phosphatase 67 38 - 126 U/L   Total Bilirubin 0.7 0.3 - 1.2 mg/dL   GFR calc non Af Amer >60 >60 mL/min   GFR calc Af Amer >60 >60 mL/min    Comment: (NOTE) The eGFR has been calculated using the CKD EPI equation. This calculation has not been validated in all clinical situations. eGFR's persistently <60 mL/min signify possible Chronic Kidney Disease.    Anion gap 9 5 - 15  Ethanol (ETOH)     Status: None   Collection Time: 04/23/15  5:43 PM  Result Value Ref Range   Alcohol, Ethyl (B) <5 <5 mg/dL    Comment:        LOWEST DETECTABLE LIMIT FOR SERUM ALCOHOL IS 5 mg/dL FOR MEDICAL PURPOSES ONLY   Salicylate level     Status: None   Collection Time: 04/23/15  5:43 PM  Result Value Ref Range   Salicylate Lvl <5.6 2.8 - 30.0 mg/dL  Acetaminophen level     Status: Abnormal   Collection Time: 04/23/15  5:43 PM  Result Value Ref Range   Acetaminophen (Tylenol), Serum <10 (L) 10 - 30 ug/mL    Comment:        THERAPEUTIC CONCENTRATIONS VARY SIGNIFICANTLY. A RANGE OF 10-30 ug/mL MAY BE AN EFFECTIVE CONCENTRATION FOR MANY PATIENTS. HOWEVER, SOME ARE BEST TREATED AT CONCENTRATIONS OUTSIDE THIS RANGE. ACETAMINOPHEN CONCENTRATIONS >150 ug/mL AT 4 HOURS AFTER INGESTION AND >50 ug/mL AT 12 HOURS AFTER INGESTION ARE OFTEN ASSOCIATED WITH TOXIC REACTIONS.   CBC     Status: Abnormal   Collection Time: 04/23/15  5:43 PM  Result Value Ref Range   WBC 10.2 3.8 - 10.6  K/uL   RBC 5.32 4.40 - 5.90 MIL/uL   Hemoglobin 15.5 13.0 - 18.0 g/dL   HCT 46.5 40.0 - 52.0 %   MCV 87.4 80.0 - 100.0 fL   MCH 29.1 26.0 - 34.0 pg   MCHC 33.3 32.0 - 36.0 g/dL   RDW 13.8 11.5 - 14.5 %   Platelets 456 (H) 150 - 440 K/uL  Urine Drug Screen, Qualitative ([place] only)     Status: Abnormal   Collection Time: 04/23/15  5:43 PM  Result Value Ref Range   Tricyclic, Ur Screen NONE DETECTED NONE DETECTED   Amphetamines, Ur Screen NONE DETECTED NONE DETECTED   MDMA (Ecstasy)Ur Screen NONE DETECTED NONE DETECTED   Cocaine Metabolite,Ur [place] NONE DETECTED NONE DETECTED   Opiate, Ur Screen NONE DETECTED NONE DETECTED   Phencyclidine (PCP) Ur S NONE DETECTED NONE DETECTED   Cannabinoid 50 Ng, Ur [place] POSITIVE (A) NONE DETECTED   Barbiturates, Ur Screen NONE DETECTED NONE DETECTED   Benzodiazepine, Ur Scrn NONE DETECTED NONE DETECTED   Methadone Scn, Ur NONE DETECTED NONE DETECTED    Comment: (NOTE) 093  Tricyclics, urine               Cutoff 1000 ng/mL 200  Amphetamines, urine             Cutoff 1000 ng/mL 300  MDMA (Ecstasy), urine           Cutoff 500 ng/mL 400  Cocaine Metabolite, urine       Cutoff 300 ng/mL 500  Opiate, urine                   Cutoff 300 ng/mL 600  Phencyclidine (PCP), urine      Cutoff 25 ng/mL 700  Cannabinoid, urine              Cutoff 50 ng/mL 800  Barbiturates, urine             Cutoff 200 ng/mL 900  Benzodiazepine, urine           Cutoff 200 ng/mL 1000 Methadone, urine                Cutoff 300 ng/mL 1100 1200 The urine drug screen provides only a preliminary, unconfirmed 1300 analytical test result and should not be used for non-medical 1400 purposes. Clinical consideration and professional judgment should 1500 be applied to any positive drug screen result due to possible 1600 interfering substances. A more specific alternate chemical method 1700 must be used in order to obtain a confirmed analytical result.  1800 Gas chromato graphy / mass  spectrometry (GC/MS) is the preferred 1900 confirmatory method.   Troponin I     Status: None   Collection Time: 04/23/15  5:43 PM  Result Value Ref Range   Troponin I <0.03 <0.031 ng/mL    Comment:        NO INDICATION OF MYOCARDIAL INJURY.   TSH     Status: None   Collection Time: 04/23/15  5:43 PM  Result Value Ref Range   TSH 0.685 0.350 - 4.500 uIU/mL    No current facility-administered medications for this encounter.   Current Outpatient Prescriptions  Medication Sig Dispense Refill  . QUEtiapine (SEROQUEL) 100 MG tablet Take 1 tablet (100 mg total) by mouth at bedtime. 30 tablet 0  . dicyclomine (BENTYL) 10 MG capsule Take 1 capsule (10 mg total) by mouth 4 (four) times daily. (Patient not taking: Reported on 04/23/2015) 10 capsule 0  . ondansetron (ZOFRAN) 4 MG tablet Take 1 tablet (4 mg total) by mouth daily as needed for nausea or vomiting. (Patient not taking: Reported on 04/23/2015) 10 tablet 0  . ziprasidone (GEODON) 40 MG capsule Take 1 capsule (40 mg total) by mouth daily with supper. (Patient not taking: Reported on 04/23/2015) 30 capsule 0    Musculoskeletal: Strength & Muscle Tone: within  normal limits Gait & Station: normal Patient leans: N/A  Psychiatric Specialty Exam: Review of Systems  Constitutional: Negative.   HENT: Negative.   Eyes: Negative.   Respiratory: Negative.   Cardiovascular: Negative.   Gastrointestinal: Negative.   Genitourinary: Negative.   Musculoskeletal: Negative.   Skin: Negative.   Neurological: Negative.   Endo/Heme/Allergies: Negative.   Psychiatric/Behavioral: Positive for depression. The patient is nervous/anxious.     Blood pressure 147/90, pulse 78, temperature 98.7 F (37.1 C), temperature source Oral, resp. rate 20, height 5' 9" (1.753 m), weight 195 lb (88.451 kg), SpO2 100 %.Body mass index is 28.78 kg/(m^2).  General Appearance: Casual  Eye Contact::  Fair  Speech:  Normal Rate  Volume:  Normal  Mood:  Angry,  Anxious, Depressed and and poor  Affect:  Constricted  Thought Process:  Circumstantial  Orientation:  Full (Time, Place, and Person)  Thought Content:  WDL  Suicidal Thoughts:  No  Homicidal Thoughts:  No  Memory:  Immediate;   Fair Recent;   Fair Remote;   Fair adequate.  Judgement:  Poor  Insight:  Shallow  Psychomotor Activity:  Normal  Concentration:  Poor  Recall:  Grant of Knowledge:Fair  Language: Fair  Akathisia:  No  Handed:  Right  AIMS (if indicated):     Assets:  Communication Skills Intimacy  ADL's:  Intact  Cognition: WNL  Sleep:      Treatment Plan Summary: Plan admit Inpt BH to [facility] after medically cleared and stable and bed is available  Disposition: Recommend psychiatric Inpatient admission when medically cleared.  Dewain Penning 04/24/2015 4:38 PM

## 2015-04-24 NOTE — ED Notes (Signed)
Pt. To ED-BHU from ED ambulatory without difficulty, to room #5  . Report from RN. Pt. Is alert and oriented, warm and dry in no distress. Pt. Denies SI, HI, and AVH. Pt. Calm and cooperative. Pt. Made aware of security cameras and Q15 minute rounds. Pt. Encouraged to let Nursing staff know of any concerns or needs.  

## 2015-04-24 NOTE — ED Notes (Signed)
Meal served.  Patient is restless and pacing the unit. No other signs of agitation. Maintained on 15 minute checks and observation by security camera for safety.

## 2015-04-24 NOTE — ED Notes (Signed)
Pt. Noted in room; after going to the bathroom and asking for fluids.. No complaints or concerns voiced. No distress or abnormal behavior noted. Will continue to monitor with security cameras. Q 15 minute rounds continue.

## 2015-04-24 NOTE — Progress Notes (Signed)
Pt. has been accepted to [facility]. Bed 400-1  Accepting physician is Dr. Renata Caprice. Attending Physician is Dr. Jama Flavors   Call report to [phone].  Representative was [employer].  ER Staff is aware of it [place] [facility].; Dr. Inocencio Homes , ER MD & Amy Patient's Nurse)       04/24/2015 Cheryl Flash, MS, NCC, LPCA Therapeutic Triage Specialist

## 2015-04-24 NOTE — ED Notes (Signed)
Patient getting more upset, verbally loud and agitated. Insisting family does not care about him, he would rather be dead. Unable to respond to verbal de-escalation.  Case discussed with MD. Order for Ativan obtained to reduce agitation and assist with behavioral control.

## 2015-04-24 NOTE — ED Notes (Signed)
Pt. Noted in room. No complaints or concerns voiced. No distress or abnormal behavior noted. Will continue to monitor with security cameras. Q 15 minute rounds continue. 

## 2015-04-24 NOTE — ED Notes (Signed)
Report received from Jaime Shannon., RN. Pt. Alert and oriented in no distress denies SI, HI, AVH and pain.  Pt. Instructed to come to me with problems or concerns.Will continue to monitor for safety via security cameras and Q 15 minute checks.

## 2015-04-24 NOTE — ED Notes (Signed)
Patient educated regarding ordered medication. He agreed to take medication. Maintained on 15 minute checks and observation by security camera for safety.

## 2015-04-25 ENCOUNTER — Inpatient Hospital Stay (HOSPITAL_COMMUNITY)
Admission: AD | Admit: 2015-04-25 | Discharge: 2015-05-03 | DRG: 885 | Disposition: A | Discharge status: Home / Self Care | Payer: No Typology Code available for payment source | Source: Intra-hospital | Attending: Psychiatry | Admitting: Psychiatry

## 2015-04-25 ENCOUNTER — Encounter (HOSPITAL_COMMUNITY): Payer: Self-pay

## 2015-04-25 DIAGNOSIS — F410 Panic disorder [episodic paroxysmal anxiety] without agoraphobia: Secondary | ICD-10-CM | POA: Diagnosis present

## 2015-04-25 DIAGNOSIS — F419 Anxiety disorder, unspecified: Secondary | ICD-10-CM | POA: Diagnosis present

## 2015-04-25 DIAGNOSIS — G4700 Insomnia, unspecified: Secondary | ICD-10-CM | POA: Diagnosis present

## 2015-04-25 DIAGNOSIS — Z811 Family history of alcohol abuse and dependence: Secondary | ICD-10-CM | POA: Diagnosis not present

## 2015-04-25 DIAGNOSIS — F17200 Nicotine dependence, unspecified, uncomplicated: Secondary | ICD-10-CM | POA: Diagnosis present

## 2015-04-25 DIAGNOSIS — F1220 Cannabis dependence, uncomplicated: Secondary | ICD-10-CM | POA: Diagnosis present

## 2015-04-25 DIAGNOSIS — Z818 Family history of other mental and behavioral disorders: Secondary | ICD-10-CM | POA: Diagnosis not present

## 2015-04-25 DIAGNOSIS — Z9119 Patient's noncompliance with other medical treatment and regimen: Secondary | ICD-10-CM | POA: Diagnosis not present

## 2015-04-25 DIAGNOSIS — F3164 Bipolar disorder, current episode mixed, severe, with psychotic features: Secondary | ICD-10-CM | POA: Diagnosis present

## 2015-04-25 DIAGNOSIS — F3163 Bipolar disorder, current episode mixed, severe, without psychotic features: Secondary | ICD-10-CM | POA: Diagnosis present

## 2015-04-25 DIAGNOSIS — F4310 Post-traumatic stress disorder, unspecified: Secondary | ICD-10-CM | POA: Diagnosis present

## 2015-04-25 MED ORDER — LORAZEPAM 1 MG PO TABS
ORAL_TABLET | ORAL | Status: AC
  Filled 2015-04-25: qty 1

## 2015-04-25 MED ORDER — LORAZEPAM 1 MG PO TABS
1.0000 mg | ORAL_TABLET | Freq: Once | ORAL | Status: AC
  Administered 2015-04-25: 1 mg via ORAL

## 2015-04-25 MED ORDER — MAGNESIUM HYDROXIDE 400 MG/5ML PO SUSP: 30.0000 mL | Freq: Every day | ORAL | Status: DC | PRN

## 2015-04-25 MED ORDER — ALUM & MAG HYDROXIDE-SIMETH 200-200-20 MG/5ML PO SUSP: 30.0000 mL | ORAL | Status: DC | PRN

## 2015-04-25 MED ORDER — ACETAMINOPHEN 325 MG PO TABS
650.0000 mg | ORAL_TABLET | Freq: Four times a day (QID) | ORAL | Status: DC | PRN
  Administered 2015-04-30 – 2015-05-01 (×2): 650 mg via ORAL
  Filled 2015-04-25 (×2): qty 2

## 2015-04-25 MED ORDER — QUETIAPINE FUMARATE 100 MG PO TABS
100.0000 mg | ORAL_TABLET | Freq: Every day | ORAL | Status: DC
  Administered 2015-04-25: 100 mg via ORAL
  Filled 2015-04-25 (×3): qty 1

## 2015-04-25 MED ORDER — GABAPENTIN 300 MG PO CAPS
300.0000 mg | ORAL_CAPSULE | Freq: Three times a day (TID) | ORAL | Status: DC
  Administered 2015-04-25 – 2015-04-26 (×2): 300 mg via ORAL
  Filled 2015-04-25 (×7): qty 1

## 2015-04-25 MED ORDER — NICOTINE POLACRILEX 2 MG MT GUM
2.0000 mg | CHEWING_GUM | OROMUCOSAL | Status: DC | PRN
  Administered 2015-04-25 – 2015-05-03 (×17): 2 mg via ORAL
  Filled 2015-04-25 (×9): qty 1

## 2015-04-25 NOTE — ED Notes (Signed)
Patient showering

## 2015-04-25 NOTE — ED Notes (Signed)
Pt. Noted in room. No complaints or concerns voiced. No distress or abnormal behavior noted. Will continue to monitor with security cameras. Q 15 minute rounds continue. 

## 2015-04-25 NOTE — Progress Notes (Signed)
Per [facility] AC pt not to be transported until they call to confirm it is ok.

## 2015-04-25 NOTE — ED Notes (Signed)
Pt picked up by sheriff department.

## 2015-04-25 NOTE — Progress Notes (Signed)
Patient ID: Jaime Shannon, male   DOB: March 16, 1981, 35 y.o.   MRN: [id number] Initial Interdisciplinary Treatment Plan   PATIENT STRESSORS: Marital or family conflict Medication change or noncompliance Manic Behaviors   PATIENT STRENGTHS: Ability for insight Active sense of humor Average or above average intelligence Capable of independent living Communication skills General fund of knowledge Motivation for treatment/growth Physical Health Supportive family/friends Work skills   PROBLEM LIST: Problem List/Patient Goals Date to be addressed Date deferred Reason deferred Estimated date of resolution  "I sleep one hour and wake up, every hour" 04/25/2015      "I try to tell myself not act that way" - manic behaviors 04/25/2015     "I don't take my meds"  04/25/2015     "I have bipolar mania, anxiety, PTSD" 04/25/2015     "my wife is mad at me" 04/25/2015                              DISCHARGE CRITERIA:  Ability to meet basic life and health needs Improved stabilization in mood, thinking, and/or behavior Motivation to continue treatment in a less acute level of care Reduction of life-threatening or endangering symptoms to within safe limits Verbal commitment to aftercare and medication compliance  PRELIMINARY DISCHARGE PLAN: Outpatient therapy Participate in family therapy Return to previous living arrangement Return to previous work or school arrangements  PATIENT/FAMIILY INVOLVEMENT: This treatment plan has been presented to and reviewed with the patient, Jaime Shannon.The patient and family have been given the opportunity to ask questions and make suggestions.  Aurora Mask 04/25/2015, 6:59 PM

## 2015-04-25 NOTE — ED Provider Notes (Signed)
 -----------------------------------------   1:28 PM on 04/25/2015 -----------------------------------------  Remains medically stable.  Awaiting transport to inpatient behavioral medicine unit.  Sharman Cheek, MD 04/25/15 [id number]

## 2015-04-25 NOTE — ED Provider Notes (Signed)
-----------------------------------------   7:01 AM on 04/25/2015 -----------------------------------------   Blood pressure 147/90, pulse 78, temperature 98.7 F (37.1 C), temperature source Oral, resp. rate 20, height  (1.753 m), weight 195 lb (88.451 kg), SpO2 100 %.  The patient had no acute events since last update.  Calm and cooperative at this time.  Disposition is pending per Psychiatry/Behavioral Medicine team recommendations.     Irean Hong, MD 04/25/15 [phone]

## 2015-04-25 NOTE — ED Provider Notes (Signed)
 -----------------------------------------   8:14 AM on 04/25/2015 -----------------------------------------  Patient accepted for transfer to [facility] under care of Dr. Jama Flavors. Patient remains medically stable with unremarkable vital signs.  Sharman Cheek, MD 04/25/15 [phone]

## 2015-04-25 NOTE — ED Notes (Signed)
Spoke with Julieanne Cotton at [facility].  She is asking for [place] to hold patient until a bed becomes available.

## 2015-04-25 NOTE — Progress Notes (Signed)
Adult Psychoeducational Group Note  Date:  04/25/2015 Time:  8:32 PM  Group Topic/Focus:  Wrap-Up Group:   The focus of this group is to help patients review their daily goal of treatment and discuss progress on daily workbooks.  Participation Level:  Active  Participation Quality:  Attentive  Affect:  Appropriate  Cognitive:  Appropriate  Insight: Appropriate and Good  Engagement in Group:  Engaged  Modes of Intervention:  Discussion  Additional Comments:  Pt mentioned he had a wonderful day. He stated " I ate great food, showered and I am alive, so I am Thankful". Pt goal is to take medication so his family do not think he is crazy.   Merlinda Frederick 04/25/2015, 8:32 PM

## 2015-04-25 NOTE — ED Notes (Signed)
Patient is anxious about transport to [facility].  He is anxious about being handcuffed and placed in a police car.  He feels that he will hurt himself in a panic attack if handcuffed.

## 2015-04-25 NOTE — ED Notes (Signed)
Patient up pacing the unit.  No distress noted. Patient states, "I can't lay in the bed all day."
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
## 2015-04-25 NOTE — ED Notes (Signed)
Preparing patient for transport.  

## 2015-04-25 NOTE — Progress Notes (Signed)
LCSW attempted to engage with patient who was  Irritable and agitated and pacing back and forth in the main room of unit. I apologized for disturbing him, he seemed to calm done after that.  In consultation patient is awaiting transport to [facility] as per TTS

## 2015-04-25 NOTE — ED Notes (Signed)
Pt pacing stating he is having a panic attack .Marland Kitchen Ed dr called , meds given

## 2015-04-25 NOTE — Progress Notes (Signed)
LCSW was advised by BHU Nurse patient to transfer to [facility]. No needs at this time.  [employer] LCSW  [phone]

## 2015-04-26 ENCOUNTER — Encounter (HOSPITAL_COMMUNITY): Payer: Self-pay | Admitting: Psychiatry

## 2015-04-26 DIAGNOSIS — F1220 Cannabis dependence, uncomplicated: Secondary | ICD-10-CM | POA: Clinically undetermined

## 2015-04-26 DIAGNOSIS — F3164 Bipolar disorder, current episode mixed, severe, with psychotic features: Principal | ICD-10-CM

## 2015-04-26 LAB — TSH: TSH: 3.122 u[IU]/mL (ref 0.350–4.500)

## 2015-04-26 LAB — LIPID PANEL
Cholesterol: 229 mg/dL — ABNORMAL HIGH (ref 0–200)
HDL: 50 mg/dL (ref >40)
LDL Cholesterol: 128 mg/dL — ABNORMAL HIGH (ref 0–99)
TRIGLYCERIDES: 253 mg/dL — AB (ref <150)
Total CHOL/HDL Ratio: 4.6 RATIO
VLDL: 51 mg/dL — AB (ref 0–40)

## 2015-04-26 MED ORDER — LORAZEPAM 1 MG PO TABS
1.0000 mg | ORAL_TABLET | ORAL | Status: AC | PRN
  Administered 2015-04-28: 1 mg via ORAL
  Filled 2015-04-26: qty 1

## 2015-04-26 MED ORDER — DOUBLE ANTIBIOTIC 500-10000 UNIT/GM EX OINT
TOPICAL_OINTMENT | Freq: Two times a day (BID) | CUTANEOUS | Status: DC
  Filled 2015-04-26 (×17): qty 1

## 2015-04-26 MED ORDER — HYDROXYZINE HCL 25 MG PO TABS: 25.0000 mg | ORAL_TABLET | Freq: Four times a day (QID) | ORAL | Status: DC | PRN

## 2015-04-26 MED ORDER — RISPERIDONE 1 MG PO TBDP
ORAL_TABLET | ORAL | Status: AC
  Filled 2015-04-26: qty 2

## 2015-04-26 MED ORDER — QUETIAPINE FUMARATE 50 MG PO TABS
150.0000 mg | ORAL_TABLET | Freq: Every day | ORAL | Status: DC
  Administered 2015-04-26 – 2015-04-27 (×2): 150 mg via ORAL
  Filled 2015-04-26 (×3): qty 3

## 2015-04-26 MED ORDER — LAMOTRIGINE 25 MG PO TABS
25.0000 mg | ORAL_TABLET | Freq: Every day | ORAL | Status: DC
Start: 2015-04-26 — End: 2015-05-03
  Administered 2015-04-26 – 2015-05-03 (×8): 25 mg via ORAL
  Filled 2015-04-26 (×11): qty 1

## 2015-04-26 MED ORDER — RISPERIDONE 2 MG PO TBDP
2.0000 mg | ORAL_TABLET | Freq: Three times a day (TID) | ORAL | Status: DC | PRN
  Administered 2015-04-26 – 2015-04-27 (×2): 2 mg via ORAL
  Filled 2015-04-26: qty 2

## 2015-04-26 MED ORDER — LORAZEPAM 2 MG/ML IJ SOLN: 2.0000 mg | Freq: Once | INTRAMUSCULAR | Status: DC

## 2015-04-26 MED ORDER — ZIPRASIDONE MESYLATE 20 MG IM SOLR: 20.0000 mg | INTRAMUSCULAR | Status: DC | PRN

## 2015-04-26 MED ORDER — DOUBLE ANTIBIOTIC 500-10000 UNIT/GM EX OINT
TOPICAL_OINTMENT | Freq: Two times a day (BID) | CUTANEOUS | Status: DC
  Administered 2015-04-26: 1 via TOPICAL
  Administered 2015-04-27 – 2015-05-03 (×13): via TOPICAL
  Filled 2015-04-26: qty 28.35

## 2015-04-26 NOTE — Progress Notes (Signed)
NUTRITION NOTE  Consult received for hyperlipidemia. Packet outlining general healthy eating and information specific to hyperlipidemia to be provided to pt by RN per protocol.  Labs reviewed; cholesterol: 229 mg/dL, triglycerides: 409 mg/dL, HDL: 50 mg/dL, LDL: 811 m/dL, VLDL: 51 mg/dL.  Please re-consult if further needs arise.    Trenton Gammon, RD, LDN Inpatient Clinical Dietitian Pager # [phone] After hours/weekend pager # [phone]

## 2015-04-26 NOTE — Progress Notes (Signed)
Adult Psychoeducational Group Note  Date:  04/26/2015 Time:  8:15 PM  Group Topic/Focus:  Wrap-Up Group:   The focus of this group is to help patients review their daily goal of treatment and discuss progress on daily workbooks.  Participation Level:  Active  Participation Quality:  Appropriate  Affect:  Appropriate  Cognitive:  Appropriate  Insight: Good  Engagement in Group:  Engaged  Modes of Intervention:  Discussion  Additional Comments:  Pt was pleasant during wrap-up group. Pt rated his overall day a 9 out of 10 because he received some bad news this morning, but he got through it and he learned to not focus on what he cannot control and he learned to focus on what he can control. Pt reported that he achieved his goal for the day, which was to take his medications. Pt noted that being alive and healthy was the highlight of his day.   Cleotilde Neer 04/26/2015, 8:32 PM
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
## 2015-04-26 NOTE — [facility] Group Notes (Signed)
[facility] LCSW Group Therapy  04/26/2015 2:25 PM   Type of Therapy:  Group Therapy  Participation Level:  Active  Participation Quality:  Attentive  Affect:  Appropriate  Cognitive:  Appropriate  Insight:  Improving  Engagement in Therapy:  Engaged  Modes of Intervention:  Clarification, Education, Exploration and Socialization  Summary of Progress/Problems: Today's group focused on relapse prevention.  We defined the term, and then brainstormed on ways to prevent relapse.  Prior to group was observed in the hall looking agitated, upset.  Came top group, stood in the back appearing same as in the hall.  When engaged, stated he was forced to come here "because some people think I am crazy, and that's just not right."  Reassured and empathized with him.  As others spoke, he softened, took a seat and joined in the conversation.  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  Despite his original statements, feels like he has been treated with compassion and respect here.  Jaime Shannon 04/26/2015 , 2:25 PM

## 2015-04-26 NOTE — Progress Notes (Signed)
D- Patient is labile this shift.  Early in shift patient had c/o having a panic attack.  Patient was observed kneeling in the hall way, heavy respirations, and banging his hand on the ground.  Patient was encouraged to go to the quiet room where he was guided through slow deep breathing techniques which were effective in decreasing his anxiety.  Throughout the shift patients mood has fluctuated between calm and irritable.  Patient denies AVH. On patient self-inventory, patient rates his depression "0", feelings of hopelessness "5", and anxiety "8".   A- Scheduled and PRN medications administered to patient, per MD orders. Support and encouragement provided.  Routine safety checks conducted every 15 minutes.  Patient informed to notify staff with problems or concerns. R- No adverse drug reactions noted. Patient contracts for safety at this time. Patient compliant with medications and treatment plan. Patient remains safe at this time.

## 2015-04-26 NOTE — [facility] Group Notes (Signed)
[facility] Group Notes:  (Nursing/MHT/Case Management/Adjunct)  Date:  04/26/2015  Time:  1000  Type of Therapy:  Nurse Education  Participation Level:  Active  Participation Quality:  Appropriate and Attentive  Affect:  Anxious and Appropriate  Cognitive:  Alert and Appropriate  Insight:  Appropriate  Engagement in Group:  Engaged  Modes of Intervention:  Discussion and Education  Summary of Progress/Problems: Patient attended and actively participated in group until he was called out by the MD.   Today's group focused on education regarding recovery.   Jaime Shannon P 04/26/2015, 1000

## 2015-04-26 NOTE — B[facility] Suicide Risk Assessment (Signed)
[facility] Admission Suicide Risk Assessment   Nursing information obtained from:    Demographic factors:    Current Mental Status:    Loss Factors:    Historical Factors:    Risk Reduction Factors:     Total Time spent with patient: 30 minutes Principal Problem: Bipolar disorder, curr episode mixed, severe, with psychotic features (HCC) Diagnosis:   Patient Active Problem List   Diagnosis Date Noted  . Bipolar disorder, curr episode mixed, severe, with psychotic features (HCC) [F31.64] 04/26/2015  . Cannabis use disorder, severe, dependence (HCC) [F12.20] 04/26/2015   Subjective Data: Please see H&P.   Continued Clinical Symptoms:  Alcohol Use Disorder Identification Test Final Score (AUDIT): 2 The "Alcohol Use Disorders Identification Test", Guidelines for Use in Primary Care, Second Edition.  World Science writer [facility]). Score between 0-7:  no or low risk or alcohol related problems. Score between 8-15:  moderate risk of alcohol related problems. Score between 16-19:  high risk of alcohol related problems. Score 20 or above:  warrants further diagnostic evaluation for alcohol dependence and treatment.   CLINICAL FACTORS:   Bipolar Disorder:   Mixed State Alcohol/Substance Abuse/Dependencies Unstable or Poor Therapeutic Relationship Previous Psychiatric Diagnoses and Treatments   Musculoskeletal: Strength & Muscle Tone: within normal limits Gait & Station: normal Patient leans: N/A  Psychiatric Specialty Exam: Review of Systems  Psychiatric/Behavioral: Positive for depression and substance abuse. The patient is nervous/anxious and has insomnia.   All other systems reviewed and are negative.   Blood pressure 141/88, pulse 158, temperature 98.2 F (36.8 C), temperature source Oral, resp. rate 16, height  (1.727 m), weight 84.46 kg (186 lb 3.2 oz).Body mass index is 28.32 kg/(m^2).   Please see H&P FOR MSE.     COGNITIVE FEATURES THAT CONTRIBUTE TO RISK:   Closed-mindedness, Polarized thinking and Thought constriction (tunnel vision)    SUICIDE RISK:   Mild:  Suicidal ideation of limited frequency, intensity, duration, and specificity.  There are no identifiable plans, no associated intent, mild dysphoria and related symptoms, good self-control (both objective and subjective assessment), few other risk factors, and identifiable protective factors, including available and accessible social support.  PLAN OF CARE: Please see H&P.   I certify that inpatient services furnished can reasonably be expected to improve the patient's condition.   Eriona Kinchen, MD 04/26/2015, 12:27 PM
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
## 2015-04-27 DIAGNOSIS — F410 Panic disorder [episodic paroxysmal anxiety] without agoraphobia: Secondary | ICD-10-CM

## 2015-04-27 LAB — HEMOGLOBIN A1C
Hgb A1c MFr Bld: 5.6 % (ref 4.8–5.6)
MEAN PLASMA GLUCOSE: 114 mg/dL

## 2015-04-27 LAB — T4: T4, Total: 8.7 ug/dL (ref 4.5–12.0)

## 2015-04-27 LAB — PROLACTIN: PROLACTIN: 20.6 ng/mL — AB (ref 4.0–15.2)

## 2015-04-27 MED ORDER — HYDROXYZINE HCL 50 MG PO TABS
50.0000 mg | ORAL_TABLET | Freq: Three times a day (TID) | ORAL | Status: DC | PRN
  Administered 2015-04-27 – 2015-05-02 (×8): 50 mg via ORAL
  Filled 2015-04-27 (×8): qty 1

## 2015-04-27 NOTE — Plan of Care (Signed)
Problem: Alteration in mood Goal: STG-Patient does not harm self or others Outcome: Progressing Pt safe at this time

## 2015-04-27 NOTE — BHH Group Notes (Signed)
[facility] LCSW Aftercare Discharge Planning Group Note   04/27/2015 1:25 PM  Participation Quality:  Active  Mood/Affect:  Appropriate  Depression Rating:  Denies  Anxiety Rating:  8  Thoughts of Suicide:  No Will you contract for safety?   NA  Current AVH:  No  Plan for Discharge/Comments:  Pt denies depression and SI but states that he has been struggling a lot with anxiety. Pt explained that several times a day his heart will start pounding for no reason "out of nowhere" and he would like help managing that. Pt also reports that his sleep has been poor because he is used to going to sleep very late due to working long hours at his job 6 days a week. Pt states he will return back to work immediately after d/c.   Transportation Means:   Supports:  Jonathon Jordan

## 2015-04-27 NOTE — Tx Team (Signed)
Interdisciplinary Treatment Plan Update (Adult)  Date:  04/27/2015 Time Reviewed:  4:18 PM  Progress in Treatment: Attending groups: Yes. Participating in groups: Yes. Taking medication as prescribed:  Yes. Tolerating medication:  Yes. Family/Significant othe contact made:  Yes Patient understands diagnosis:  Yes, as evidenced by seeking help with anxiety and panic. Discussing patient identified problems/goals with staff:  Yes, see initial care plan. Medical problems stabilized or resolved:  Yes Denies suicidal/homicidal ideation: Yes. Issues/concerns per patient self-inventory: No. Other:  New problem(s) identified:   Discharge Plan or Barriers: See below  Reason for Continuation of Hospitalization: Anxiety Depression Hallucinations Medication stabilization  Comments: Jaime Shannon is a 58 y old CM , married , employed as a Biomedical scientist , lives with his wife in [place] , has a past hx of Bipolar disorder, PTSD, noncompliant with treatment. Pt per initial notes in EHR presented to [facility] with c/o wanting to get help, since his family wanted him IVCed. Pt recently was seen at [facility] on 04/06/15 for SI , and was discharged with out pt referral. Lamictal, Seroquel trial  Estimated length of stay: 4-5 days  New goal(s):  Review of initial/current patient goals per problem list:  1. Goal(s): Patient will participate in aftercare plan  Met:Yes   Target date: at discharge  As evidenced by: Patient will participate within aftercare plan AEB aftercare provider and housing plan at discharge being identified.  04/27/15: Pt will return home and follow-up outpt.  2. Goal (s): Patient will exhibit decreased depressive symptoms and suicidal ideations.  Met:Yes   Target date: at discharge  As evidenced by: Patient will utilize self rating of depression at 3 or below and demonstrate decreased signs of depression or be deemed stable for discharge by MD.  04/27/15: Pt denies SI and rates depression as  0/10.  3. Goal(s): Patient will demonstrate decreased signs and symptoms of anxiety.  Met: No   Target date: at discharge  As evidenced by: Patient will utilize self rating of anxiety at 3 or below and demonstrated decreased signs of anxiety, or be deemed stable for discharge by MD  04/27/15: Pt rates anxiety as 8/10 today.  4. Goal(s): Patient will demonstrate decreased signs of psychosis.  Met: Yes  Target date:at discharge  As evidenced by: Patient will demonstrate decreased signs of psychosis as evidenced by a reduction in AVH, paranoia, and/or delusions.   04/27/15: Pt denies AVH.  Attendees: Patient:  04/27/2015 4:18 PM  Family:   04/27/2015 4:18 PM  Physician:  Dr. Ursula Alert, MD 04/27/2015 4:18 PM  Nursing: Manuella Ghazi, RN 04/27/2015 4:18 PM  Case Manager:  Roque Lias, LCSW 04/27/2015 4:18 PM  Counselor:  Matthew Saras, MSW Intern 04/27/2015 4:18 PM  Other:   04/27/2015 4:18 PM  Other:   04/27/2015 4:18 PM  Other:   04/27/2015 4:18 PM  Other:  04/27/2015 4:18 PM  Other:    Other:    Other:    Other:    Other:    Other:      Scribe for Treatment Team:   Georga Kaufmann, MSW Intern 04/27/2015 4:18 PM

## 2015-04-27 NOTE — Progress Notes (Signed)
DAR NOTE: Patient presents with anxious mood and affect.  Denies pain, auditory and visual hallucinations.  Rates depression at 0, hopelessness at 0, and anxiety at 1.  Describes energy level as high and concentration as good.  Maintained on routine safety checks.  Medications given as prescribed.  Support and encouragement offered as needed.  Attended group and participated.  States goal for today is "stay calm and not let what I can't control, control me."    Risperdal 2 mg given for anxiety and agitation with good effect.

## 2015-04-27 NOTE — Progress Notes (Signed)
D: Pt denies SI/HI/AVH. Pt is pleasant and cooperative. Pt pt stated he was feeling better due to the medications and has a better attitude about the things he can't control and working on the things he can control.   A: Pt was offered support and encouragement. Pt was given scheduled medications. Pt was encourage to attend groups. Q 15 minute checks were done for safety.   R:Pt attends groups and interacts well with peers and staff. Pt is taking medication. Pt has no complaints at this time.Pt receptive to treatment and safety maintained on unit.

## 2015-04-27 NOTE — [facility] Group Notes (Signed)
[facility] LCSW Group Therapy  04/27/2015 1:42 PM  Type of Therapy: Group Therapy  Participation Level: Active  Participation Quality: Attentive  Affect: Flat  Cognitive: Oriented  Insight: Limited  Engagement in Therapy: Engaged  Modes of Intervention: Discussion and Socialization  Summary of Progress/Problems: Onalee Hua from [employer] was here to tell his story of recovery and play his guitar. Pt was pleasant and stayed for the entire group. Pt dozed off a few times.  Vito Backers. Beverely Pace 04/27/2015 1:42 PM

## 2015-04-27 NOTE — [facility] Suicide Risk Assessment (Signed)
[facility] INPATIENT:  Family/Significant Other Suicide Prevention Education  Suicide Prevention Education:  Education Completed; Mother Melody Petra Kuba [phone],  (name of family member/significant other) has been identified by the patient as the family member/significant other with whom the patient will be residing, and identified as the person(s) who will aid the patient in the event of a mental health crisis (suicidal ideations/suicide attempt).  With written consent from the patient, the family member/significant other has been provided the following suicide prevention education, prior to the and/or following the discharge of the patient.  The suicide prevention education provided includes the following:  Suicide risk factors  Suicide prevention and interventions  National Suicide Hotline telephone number  [facility] assessment telephone number  [facility] Emergency Assistance 911  [facility] and/or Residential Mobile Crisis Unit telephone number  Request made of family/significant other to:  Remove weapons (e.g., guns, rifles, knives), all items previously/currently identified as safety concern.    Remove drugs/medications (over-the-counter, prescriptions, illicit drugs), all items previously/currently identified as a safety concern.  The family member/significant other verbalizes understanding of the suicide prevention education information provided.  The family member/significant other agrees to remove the items of safety concern listed above.  Jaime Shannon, [place] 04/27/2015, 4:39 PM

## 2015-04-27 NOTE — Progress Notes (Signed)
Adult Psychoeducational Group Note  Date:  04/27/2015 Time:  8:15 PM  Group Topic/Focus:  Wrap-Up Group:   The focus of this group is to help patients review their daily goal of treatment and discuss progress on daily workbooks.  Participation Level:  Active  Participation Quality:  Appropriate  Affect:  Appropriate  Cognitive:  Appropriate  Insight: Appropriate  Engagement in Group:  Engaged  Modes of Intervention:  Discussion  Additional Comments:  Pt was pleasant during wrap-up group. Pt rated his overall day a 9 out of 10 because he was feeling better than yesterday. Pt reported that he achieved his goal for the day, which was to take his medications and "make an attempt to believe that they will work". Pt noted that eating good food was the highlight of his day.   Cleotilde Neer 04/27/2015, 8:43 PM

## 2015-04-27 NOTE — Progress Notes (Signed)
[facility] MD Progress Note  04/27/2015 4:03 PM Jaime Shannon  MRN:  [id number] Subjective:  Patient states " I continues to have anxiety sx on and off, I would like to have something for that.'  Objective:: Jaime Shannon is a 35 y old CM , married , employed as a Investment banker, operational , lives with his wife in [place] , has a past hx of Bipolar disorder, PTSD, noncompliant with treatment. Pt per initial notes in EHR presented to [facility] with c/o wanting to get help, since his family wanted him IVCed. Pt recently was seen at [facility] on 04/06/15 for SI , and was discharged with out pt referral.  Patient seen and chart reviewed.Discussed patient with treatment team.  Pt today seen as calm,but continues to report anxiety attacks on and off. Pt with severe cannabis abuse, recently stopped using it , that could also be contributing to his anxiety sx. Pt however reports a hx of panic sx on and off. Pt discussed with about Vistaril prn - he is willing to give it a try. Also discussed CBT on an out pt basis. Per staff - pt is tolerating medications well, continues to have anxiety sx. Will continue treatment.    Principal Problem: Bipolar disorder, curr episode mixed, severe, with psychotic features (HCC) Diagnosis:   Patient Active Problem List   Diagnosis Date Noted  . Panic disorder [F41.0] 04/27/2015  . Bipolar disorder, curr episode mixed, severe, with psychotic features (HCC) [F31.64] 04/26/2015  . Cannabis use disorder, severe, dependence (HCC) [F12.20] 04/26/2015   Total Time spent with patient: 30 minutes  Past Psychiatric History: Pt reports a hx of Bipolar do, PTSD. Denies any out patient treatment at this time. Was seen at [facility] recently , was discharged with out pt referral.Pt does have hx of previous inpatient hospitalizations in the past.Denies suicide attempts.    Past Medical History:  Past Medical History  Diagnosis Date  . Kidney stone   . Bipolar 1 disorder [facility])     Past Surgical History  Procedure Laterality  Date  . Abdominal surgery    . Surgery scrotal / testicular     Family History:  Family History  Problem Relation Age of Onset  . Bipolar disorder Father   . Alcoholism Father   . Bipolar disorder Other   . Heart Problems Maternal Grandfather    Family Psychiatric  History: Pt 's dad has bipolar do. Bipolar do runs in his father's side of family. Pt reports that his father abused alcohol. Social History: Pt is employed as a Investment banker, operational , lives in [place] with his wife. Has a 30 yr old daughter , shared custody with ex wife , he gets her every Tuesday. Pt denies pending legal issues  History  Alcohol Use  . Yes     History  Drug Use  . Yes  . Special: Marijuana    Comment: every day for two weeks    Social History   Social History  . Marital Status: Married    Spouse Name: N/A  . Number of Children: N/A  . Years of Education: N/A   Social History Main Topics  . Smoking status: Current Every Day Smoker  . Smokeless tobacco: None  . Alcohol Use: Yes  . Drug Use: Yes    Special: Marijuana     Comment: every day for two weeks  . Sexual Activity: Not Asked   Other Topics Concern  . None   Social History Narrative   Additional Social History:  Pain Medications: Denies  Prescriptions: Denies  Over the Counter: Denies  History of alcohol / drug use?: Yes Longest period of sobriety (when/how long): unknown  Negative Consequences of Use: Financial Withdrawal Symptoms: Agitation, Irritability Name of Substance 1: Marijauna  1 - Age of First Use: 35 years old 1 - Amount (size/oz): 40-50$ worth a day 1 - Frequency: daily 1 - Duration: 10 years 1 - Last Use / Amount: a week ago                  Sleep: Fair  Appetite:  Fair  Current Medications: Current Facility-Administered Medications  Medication Dose Route Frequency Provider Last Rate Last Dose  . acetaminophen (TYLENOL) tablet 650 mg  650 mg Oral Q6H PRN Beau Fanny, FNP      . alum & mag  hydroxide-simeth (MAALOX/MYLANTA) 200-200-20 MG/5ML suspension 30 mL  30 mL Oral Q4H PRN Beau Fanny, FNP      . hydrOXYzine (ATARAX/VISTARIL) tablet 50 mg  50 mg Oral TID PRN Jomarie Longs, MD      . lamoTRIgine (LAMICTAL) tablet 25 mg  25 mg Oral Daily Jomarie Longs, MD   25 mg at 04/27/15 0815  . risperiDONE (RISPERDAL M-TABS) disintegrating tablet 2 mg  2 mg Oral Q8H PRN Jomarie Longs, MD   2 mg at 04/27/15 0953   And  . LORazepam (ATIVAN) tablet 1 mg  1 mg Oral PRN Jomarie Longs, MD       And  . ziprasidone (GEODON) injection 20 mg  20 mg Intramuscular PRN Ghalia Reicks, MD      . magnesium hydroxide (MILK OF MAGNESIA) suspension 30 mL  30 mL Oral Daily PRN Beau Fanny, FNP      . nicotine polacrilex (NICORETTE) gum 2 mg  2 mg Oral PRN Jomarie Longs, MD   2 mg at 04/27/15 1051  . polymixin-bacitracin (POLYSPORIN) ointment   Topical BID Craige Cotta, MD      . QUEtiapine (SEROQUEL) tablet 150 mg  150 mg Oral QHS Jomarie Longs, MD   150 mg at 04/26/15 2105    Lab Results:  Results for orders placed or performed during the hospital encounter of 04/25/15 (from the past 48 hour(s))  Hemoglobin A1c     Status: None   Collection Time: 04/26/15  6:24 AM  Result Value Ref Range   Hgb A1c MFr Bld 5.6 4.8 - 5.6 %    Comment: (NOTE)         Pre-diabetes: 5.7 - 6.4         Diabetes: >6.4         Glycemic control for adults with diabetes: <7.0    Mean Plasma Glucose 114 mg/dL    Comment: (NOTE) Performed At: [facility] [address] [place], [place] [id number] Mila Homer MD [id number] Performed at [facility]   Lipid panel     Status: Abnormal   Collection Time: 04/26/15  6:24 AM  Result Value Ref Range   Cholesterol 229 (H) 0 - 200 mg/dL   Triglycerides 956 (H) <150 mg/dL   HDL 50 >21 mg/dL   Total CHOL/HDL Ratio 4.6 RATIO   VLDL 51 (H) 0 - 40 mg/dL   LDL Cholesterol 308 (H) 0 - 99 mg/dL    Comment:        Total  Cholesterol/HDL:CHD Risk Coronary Heart Disease Risk Table  Men   Women  1/2 Average Risk   3.4   3.3  Average Risk       5.0   4.4  2 X Average Risk   9.6   7.1  3 X Average Risk  23.4   11.0        Use the calculated Patient Ratio above and the CHD Risk Table to determine the patient's CHD Risk.        ATP III CLASSIFICATION (LDL):  <100     mg/dL   Optimal  119-147  mg/dL   Near or Above                    Optimal  130-159  mg/dL   Borderline  829-562  mg/dL   High  >130     mg/dL   Very High Performed at [facility]   Prolactin     Status: Abnormal   Collection Time: 04/26/15  6:24 AM  Result Value Ref Range   Prolactin 20.6 (H) 4.0 - 15.2 ng/mL    Comment: (NOTE) Performed At: [facility] [address] [place], [place] [id number] Mila Homer MD [id number] Performed at [facility]   T4     Status: None   Collection Time: 04/26/15  6:24 AM  Result Value Ref Range   T4, Total 8.7 4.5 - 12.0 ug/dL    Comment: (NOTE) Performed At: [facility] [address] [place], [place] [id number] Mila Homer MD [id number] Performed at [facility]   TSH     Status: None   Collection Time: 04/26/15  6:24 AM  Result Value Ref Range   TSH 3.122 0.350 - 4.500 uIU/mL    Comment: Performed at [facility]    Physical Findings: AIMS: Facial and Oral Movements Muscles of Facial Expression: None, normal Lips and Perioral Area: None, normal Jaw: None, normal Tongue: None, normal,Extremity Movements Upper (arms, wrists, hands, fingers): None, normal Lower (legs, knees, ankles, toes): None, normal, Trunk Movements Neck, shoulders, hips: None, normal, Overall Severity Severity of abnormal movements (highest score from questions above): None, normal Incapacitation due to abnormal movements: None, normal Patient's awareness of abnormal movements (rate only patient's  report): No Awareness, Dental Status Current problems with teeth and/or dentures?: Yes Does patient usually wear dentures?: No  CIWA:  CIWA-Ar Total: 5 COWS:  COWS Total Score: 3  Musculoskeletal: Strength & Muscle Tone: within normal limits Gait & Station: normal Patient leans: N/A  Psychiatric Specialty Exam: Review of Systems  Psychiatric/Behavioral: Positive for substance abuse. The patient is nervous/anxious.   All other systems reviewed and are negative.   Blood pressure 125/79, pulse 83, temperature 97.8 F (36.6 C), temperature source Oral, resp. rate 16, height  (1.727 m), weight 84.46 kg (186 lb 3.2 oz).Body mass index is 28.32 kg/(m^2).  General Appearance: Casual  Eye Contact::  Fair  Speech:  Clear and Coherent  Volume:  Normal  Mood:  Anxious  Affect:  Appropriate  Thought Process:  Coherent  Orientation:  Full (Time, Place, and Person)  Thought Content:  Paranoid Ideation and Rumination  Suicidal Thoughts:  No  Homicidal Thoughts:  No  Memory:  Immediate;   Fair Recent;   Fair Remote;   Fair  Judgement:  Impaired  Insight:  Shallow  Psychomotor Activity:  Restlessness  Concentration:  Poor  Recall:  Fair  Fund of Knowledge:Fair  Language: Fair  Akathisia:  No  Handed:  Right  AIMS (if indicated):     Assets:  Communication Skills Physical Health Social Support Talents/Skills  ADL's:  Intact  Cognition: WNL  Sleep:  Number of Hours: 5.75   Treatment Plan Summary:Andray is a 43 y old CM , married ,who has a hx of Bipolar do , PTSD , cannabis abuse , who is noncompliant on medications , presents with mood lability, panic attacks , sleep issues . Pt continues to have anxiety sx . Will continue treatment.  Daily contact with patient to assess and evaluate symptoms and progress in treatment and Medication management Will continue Serqoeul to 150 mg po qhs for psychosis/mood sx/sleep. Will continue  Lamictal 25 mg po daily for mood sx. Will consider  CBT for PTSD sx, panic sx. Will add Vistaril 50 mg po tid prn for anxiety sx. Will make available PRN medications as per agitation protocol. Will continue to monitor vitals ,medication compliance and treatment side effects while patient is here.  Will monitor for medical issues as well as call consult as needed.  Reviewed labs ,cbc - wnl, cmp - wnl, lipid panel - elevated - will consult dietician, TSH - wnl , UDS -,pos for cannabinoids , BAL<5 ,EKG - qtc - wnl,  Hba1c- wnl , PL - elevated - will recommend outpt monitoring. CSW will start working on disposition.  Patient to participate in therapeutic milieu .     Yocheved Depner MD 04/27/2015, 4:03 PM

## 2015-04-28 MED ORDER — LORAZEPAM 1 MG PO TABS
1.0000 mg | ORAL_TABLET | Freq: Four times a day (QID) | ORAL | Status: DC | PRN
  Administered 2015-04-28 – 2015-04-30 (×4): 1 mg via ORAL
  Filled 2015-04-28 (×4): qty 1

## 2015-04-28 MED ORDER — QUETIAPINE FUMARATE 50 MG PO TABS
50.0000 mg | ORAL_TABLET | Freq: Every evening | ORAL | Status: DC | PRN
  Administered 2015-04-28 – 2015-05-02 (×7): 50 mg via ORAL
  Filled 2015-04-28 (×7): qty 1

## 2015-04-28 MED ORDER — RISPERIDONE 2 MG PO TBDP: 2.0000 mg | ORAL_TABLET | Freq: Four times a day (QID) | ORAL | Status: DC | PRN

## 2015-04-28 MED ORDER — LORAZEPAM 2 MG/ML IJ SOLN: 1.0000 mg | Freq: Four times a day (QID) | INTRAMUSCULAR | Status: DC | PRN

## 2015-04-28 NOTE — Progress Notes (Signed)
[facility] MD Progress Note  04/28/2015 2:59 PM Jaime Shannon  MRN:  [id number] Subjective:  Patient states " I do not want to take that sleep medication you gave me , because I had nightmare and I could not get up since I took it last night. I am not going to take any medication other than ativan or risperidone , those are medications that help me."    Objective:: Jaime Shannon is a 35 y old CM , married , employed as a Investment banker, operational , lives with his wife in [place] , has a past hx of Bipolar disorder, PTSD, noncompliant with treatment. Pt per initial notes in EHR presented to [facility] with c/o wanting to get help, since his family wanted him IVCed. Pt recently was seen at [facility] on 04/06/15 for SI , and was discharged with out pt referral.  Patient seen and chart reviewed.Discussed patient with treatment team.  Pt today seen as irritable, labile , agitated . Pt also had a panic attack this AM - required PRN medications to calm him down. Pt this AM is hostile with Clinical research associate - does not want any medication changes. Is very focussed on getting ativan for panic sx - reports that is the only medication that will work. Wants his seroquel discontinued , does not want to take more Lamictal than what is already prescribed. Pt has nightmares - does not want to be on prazosin since it almost killed him when he tried it in the past. Provided pt with medication education. Discussed the risk of being on BZD. Also discussed CBT for panic do - which can be done on an out patient basis.    Principal Problem: Bipolar disorder, curr episode mixed, severe, with psychotic features (HCC) Diagnosis:   Patient Active Problem List   Diagnosis Date Noted  . Panic disorder [F41.0] 04/27/2015  . Bipolar disorder, curr episode mixed, severe, with psychotic features (HCC) [F31.64] 04/26/2015  . Cannabis use disorder, severe, dependence (HCC) [F12.20] 04/26/2015   Total Time spent with patient: 30 minutes  Past Psychiatric History: Pt reports a hx of  Bipolar do, PTSD. Denies any out patient treatment at this time. Was seen at [facility] recently , was discharged with out pt referral.Pt does have hx of previous inpatient hospitalizations in the past.Denies suicide attempts.    Past Medical History:  Past Medical History  Diagnosis Date  . Kidney stone   . Bipolar 1 disorder [facility])     Past Surgical History  Procedure Laterality Date  . Abdominal surgery    . Surgery scrotal / testicular     Family History:  Family History  Problem Relation Age of Onset  . Bipolar disorder Father   . Alcoholism Father   . Bipolar disorder Other   . Heart Problems Maternal Grandfather    Family Psychiatric  History: Pt 's dad has bipolar do. Bipolar do runs in his father's side of family. Pt reports that his father abused alcohol. Social History: Pt is employed as a Investment banker, operational , lives in [place] with his wife. Has a 26 yr old daughter , shared custody with ex wife , he gets her every Tuesday. Pt denies pending legal issues  History  Alcohol Use  . Yes     History  Drug Use  . Yes  . Special: Marijuana    Comment: every day for two weeks    Social History   Social History  . Marital Status: Married    Spouse Name: N/A  . Number  of Children: N/A  . Years of Education: N/A   Social History Main Topics  . Smoking status: Current Every Day Smoker  . Smokeless tobacco: None  . Alcohol Use: Yes  . Drug Use: Yes    Special: Marijuana     Comment: every day for two weeks  . Sexual Activity: Not Asked   Other Topics Concern  . None   Social History Narrative   Additional Social History:    Pain Medications: Denies  Prescriptions: Denies  Over the Counter: Denies  History of alcohol / drug use?: Yes Longest period of sobriety (when/how long): unknown  Negative Consequences of Use: Financial Withdrawal Symptoms: Agitation, Irritability Name of Substance 1: Marijauna  1 - Age of First Use: 35 years old 1 - Amount (size/oz): 40-50$ worth a  day 1 - Frequency: daily 1 - Duration: 10 years 1 - Last Use / Amount: a week ago                  Sleep: Fair has nightmares  Appetite:  Fair  Current Medications: Current Facility-Administered Medications  Medication Dose Route Frequency Provider Last Rate Last Dose  . acetaminophen (TYLENOL) tablet 650 mg  650 mg Oral Q6H PRN Beau Fanny, FNP      . alum & mag hydroxide-simeth (MAALOX/MYLANTA) 200-200-20 MG/5ML suspension 30 mL  30 mL Oral Q4H PRN Beau Fanny, FNP      . hydrOXYzine (ATARAX/VISTARIL) tablet 50 mg  50 mg Oral TID PRN Jomarie Longs, MD   50 mg at 04/28/15 0807  . lamoTRIgine (LAMICTAL) tablet 25 mg  25 mg Oral Daily Jomarie Longs, MD   25 mg at 04/28/15 0807  . LORazepam (ATIVAN) tablet 1 mg  1 mg Oral Q6H PRN Jomarie Longs, MD       Or  . LORazepam (ATIVAN) injection 1 mg  1 mg Intramuscular Q6H PRN Mikhaela Zaugg, MD      . magnesium hydroxide (MILK OF MAGNESIA) suspension 30 mL  30 mL Oral Daily PRN Beau Fanny, FNP      . nicotine polacrilex (NICORETTE) gum 2 mg  2 mg Oral PRN Jomarie Longs, MD   2 mg at 04/28/15 1427  . polymixin-bacitracin (POLYSPORIN) ointment   Topical BID Rockey Situ Cobos, MD      . risperiDONE (RISPERDAL M-TABS) disintegrating tablet 2 mg  2 mg Oral Q6H PRN Jomarie Longs, MD        Lab Results:  No results found for this or any previous visit (from the past 48 hour(s)).  Physical Findings: AIMS: Facial and Oral Movements Muscles of Facial Expression: None, normal Lips and Perioral Area: None, normal Jaw: None, normal Tongue: None, normal,Extremity Movements Upper (arms, wrists, hands, fingers): None, normal Lower (legs, knees, ankles, toes): None, normal, Trunk Movements Neck, shoulders, hips: None, normal, Overall Severity Severity of abnormal movements (highest score from questions above): None, normal Incapacitation due to abnormal movements: None, normal Patient's awareness of abnormal movements (rate only  patient's report): No Awareness, Dental Status Current problems with teeth and/or dentures?: Yes Does patient usually wear dentures?: No  CIWA:  CIWA-Ar Total: 5 COWS:  COWS Total Score: 3  Musculoskeletal: Strength & Muscle Tone: within normal limits Gait & Station: normal Patient leans: N/A  Psychiatric Specialty Exam: Review of Systems  Psychiatric/Behavioral: Positive for substance abuse. The patient is nervous/anxious and has insomnia (nightmares).   All other systems reviewed and are negative.   Blood pressure 111/82, pulse 93,  temperature 97.8 F (36.6 C), temperature source Oral, resp. rate 16, height  (1.727 m), weight 84.46 kg (186 lb 3.2 oz).Body mass index is 28.32 kg/(m^2).  General Appearance: Casual  Eye Contact::  Fair  Speech:  Clear and Coherent  Volume:  Normal  Mood:  Anxious, Hopeless and Irritable  Affect:  Labile angry, hostile  Thought Process:  Coherent  Orientation:  Full (Time, Place, and Person)  Thought Content:  Paranoid Ideation and Rumination  Suicidal Thoughts:  No  Homicidal Thoughts:  No  Memory:  Immediate;   Fair Recent;   Fair Remote;   Fair  Judgement:  Impaired  Insight:  Shallow  Psychomotor Activity:  Restlessness  Concentration:  Poor  Recall:  [employer] of Knowledge:Fair  Language: Fair  Akathisia:  No  Handed:  Right  AIMS (if indicated):     Assets:  Manufacturing systems engineer Physical Health Social Support Talents/Skills  ADL's:  Intact  Cognition: WNL  Sleep:  Number of Hours: 6.75   Treatment Plan Summary:Daquann is a 43 y old CM , married ,who has a hx of Bipolar do , PTSD , cannabis abuse , who is noncompliant on medications , presents with mood lability, panic attacks , sleep issues . Pt continues to have anxiety sx and panic attacks on the unit. Pt is refusing any changes with his medications - demanding to be on ativan , reports that is the only medication that help with his panic sx. Will continue  treatment.  Daily contact with patient to assess and evaluate symptoms and progress in treatment and Medication management Will discontinue Serqoeul due to side effect concerns. Pt does not want to be on another antipsychotic scheduled  at this time.Will continue to provide medication education. Will continue  Lamictal 25 mg po daily for mood sx. Will add Risperidone M tab 2 mg po q6h prn for severe anxiety/agitation.  Will make available ativan 1 mg po/IM PRN for panic sx , severe agitation. Will consider CBT for PTSD sx, panic sx. Will continue Vistaril 50 mg po tid prn for anxiety sx. Will make available PRN medications as per agitation protocol. Will continue to monitor vitals ,medication compliance and treatment side effects while patient is here.  Will monitor for medical issues as well as call consult as needed.  Reviewed labs ,cbc - wnl, cmp - wnl, lipid panel - elevated - will consult dietician, TSH - wnl , UDS -,pos for cannabinoids , BAL<5 ,EKG - qtc - wnl,  Hba1c- wnl , PL - elevated - will recommend outpt monitoring. CSW will start working on disposition.  Patient to participate in therapeutic milieu .     Nixie Laube MD 04/28/2015, 2:59 PM

## 2015-04-28 NOTE — Progress Notes (Signed)
Adult Psychoeducational Group Note  Date:  04/28/2015 Time:  8:38 PM  Group Topic/Focus:  Wrap-Up Group:   The focus of this group is to help patients review their daily goal of treatment and discuss progress on daily workbooks.  Participation Level:  Active  Participation Quality:  Appropriate and Attentive  Affect:  Appropriate  Cognitive:  Appropriate  Insight: Appropriate and Good  Engagement in Group:  Engaged  Modes of Intervention:  Discussion  Additional Comments:  Pt rated his day a 10 out of 10. Pt mentioned he had a little anxiety this morning and his goal for tomorrow is to "not have a panic attack".   Merlinda Frederick 04/28/2015, 8:38 PM

## 2015-04-28 NOTE — [facility] Group Notes (Signed)
[facility] LCSW Group Therapy  04/28/2015 1:15 pm  Type of Therapy: Process Group Therapy  Participation Level:  Active  Participation Quality:  Appropriate  Affect:  Flat  Cognitive:  Oriented  Insight:  Improving  Engagement in Group:  Limited  Engagement in Therapy:  Limited  Modes of Intervention:  Activity, Clarification, Education, Problem-solving and Support  Summary of Progress/Problems: Today's group addressed the issue of overcoming obstacles.  Patients were asked to identify their biggest obstacle post d/c that stands in the way of their on-going success, and then problem solve as to how to manage this. Stayed the entire time, engaged throughout.  Talked about the challenge of believing in himself and staying positive.  "When I was young, it was easy, but with time and experience, and making some poor decisions, it's a lot harder to do."  Talked about his marriage that is breaking up, and how he made a mistake of trying to change for someone else instead of deciding what was right for him to do.  Also admitted that he has put all his energy into work, and not saved any for himself or his family.  "I have to figure out how to balance it."  Ida Rogue 04/28/2015   2:58 PM

## 2015-04-28 NOTE — [facility] Group Notes (Signed)
[facility] Group Notes:  (Nursing/MHT/Case Management/Adjunct)  Date:  04/28/2015  Time:  0900  Type of Therapy:  Nurse Education  Participation Level:  Did Not Attend  Participation Quality:    Affect:    Cognitive:    Insight:    Engagement in Group:    Modes of Intervention:    Summary of Progress/Problems:  Andres Ege 04/28/2015, 2:18 PM

## 2015-04-28 NOTE — Progress Notes (Addendum)
Patient ID: Jaime Shannon, male   DOB: [birth year], 35 y.o.   MRN: [id number]  D: Undersigned was called from giving medications due to patient's behavior. When arriving on the unit, the staff had let him go into the quiet room. Patient lying on mattress yelling out and was breathing hard. Put up hand to let staff know he would have to wait to speak. After he got his breathing under control he was able to speak some. He reports that he has been having these panic type attacks for years. He says it starts out as having sweaty palms and then he starts feeling his heart beat. He reports that he was trying to get BP and pulse done because he feels that his pulse has to be elevated a lot when he starts feeling this way and wants a record of it for the doctor. Patient frustrated because he feels that most doctors don't believe him and act like he just want medications.  A: Staff will continue to monitor on q 15 minute checks, follow treatment plan, and give meds as ordered. R: Cooperative once speaking to him. Given ativan along with lamictal.

## 2015-04-29 ENCOUNTER — Other Ambulatory Visit: Payer: Self-pay

## 2015-04-29 MED ORDER — BENZTROPINE MESYLATE 0.5 MG PO TABS
0.5000 mg | ORAL_TABLET | Freq: Two times a day (BID) | ORAL | Status: DC
  Administered 2015-04-29 – 2015-05-03 (×8): 0.5 mg via ORAL
  Filled 2015-04-29 (×12): qty 1

## 2015-04-29 MED ORDER — RISPERIDONE 0.5 MG PO TBDP
0.5000 mg | ORAL_TABLET | Freq: Three times a day (TID) | ORAL | Status: DC
  Administered 2015-04-29 – 2015-05-03 (×13): 0.5 mg via ORAL
  Filled 2015-04-29 (×22): qty 1

## 2015-04-29 MED ORDER — RISPERIDONE 1 MG PO TBDP: 1.0000 mg | ORAL_TABLET | Freq: Four times a day (QID) | ORAL | Status: DC | PRN

## 2015-04-29 NOTE — Progress Notes (Addendum)
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 Pt per initial notes in EHR presented to [facility] with c/o wanting to get help, since his family wanted him IVCed. Pt recently was seen at [facility] on 04/06/15 for SI , and was discharged with out pt referral.  Patient seen and chart reviewed.Discussed patient with treatment team.  Pt today seen as less irritable, less labile than previous days. Pt today is open to discussing medication options , much different from yesterday AM , when he was very irritable. Discussed with pt to start him on scheduled Risperidone , instead of PRN. Pt reported that Risperidone was effective . He agrees with plan. Pt reports sleep was good last night. Denies new concerns. Per staff - pt is tolerating medications well. Pt has had no disruptive issues noted on the unit.     Principal Problem: Bipolar disorder, curr episode mixed, severe, with psychotic features (HCC) Diagnosis:   Patient Active Problem List   Diagnosis Date Noted  . Panic disorder [F41.0] 04/27/2015  . Bipolar disorder, curr episode mixed, severe, with psychotic features (HCC) [F31.64] 04/26/2015  . Cannabis use disorder, severe, dependence (HCC) [F12.20] 04/26/2015   Total Time spent with patient: 30 minutes  Past Psychiatric History: Pt reports a hx of Bipolar do, PTSD. Denies any out patient treatment at this time. Was seen at [facility] recently , was discharged with out pt referral.Pt does have hx of previous inpatient hospitalizations in the past.Denies suicide attempts.    Past Medical History:  Past Medical History  Diagnosis Date  . Kidney stone   . Bipolar 1 disorder [facility])     Past  Surgical History  Procedure Laterality Date  . Abdominal surgery    . Surgery scrotal / testicular     Family History:  Family History  Problem Relation Age of Onset  . Bipolar disorder Father   . Alcoholism Father   . Bipolar disorder Other   . Heart Problems Maternal Grandfather    Family Psychiatric  History: Pt 's dad has bipolar do. Bipolar do runs in his father's side of family. Pt reports that his father abused alcohol. 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 Has a 31 yr old daughter , shared custody with ex wife , he gets her every Tuesday. Pt denies pending legal issues  History  Alcohol Use  . Yes     History  Drug Use  . Yes  . Special: Marijuana    Comment: every day for two weeks    Social History   Social History  . Marital Status: Married    Spouse Name: N/A  . Number of Children: N/A  . Years of Education: N/A   Social History Main Topics  . Smoking status: Current Every Day Smoker  . Smokeless tobacco: None  . Alcohol Use: Yes  . Drug Use: Yes    Special: Marijuana     Comment: every day for two weeks  . Sexual Activity: Not Asked   Other Topics Concern  . None   Social History Narrative  Additional Social History:    Pain Medications: Denies  Prescriptions: Denies  Over the Counter: Denies  History of alcohol / drug use?: Yes Longest period of sobriety (when/how long): unknown  Negative Consequences of Use: Financial Withdrawal Symptoms: Agitation, Irritability Name of Substance 1: Marijauna  1 - Age of First Use: 36 years old 1 - Amount (size/oz): 40-50$ worth a day 1 - Frequency: daily 1 - Duration: 10 years 1 - Last Use / Amount: a week ago                  Sleep: Fair   Appetite:  Fair  Current Medications: Current Facility-Administered Medications  Medication Dose Route Frequency Provider Last Rate Last Dose  . acetaminophen (TYLENOL) tablet 650 mg  650 mg Oral Q6H PRN Beau Fanny, FNP      . alum & mag hydroxide-simeth (MAALOX/MYLANTA) 200-200-20 MG/5ML suspension 30 mL  30 mL Oral Q4H PRN Beau Fanny, FNP      . benztropine (COGENTIN) tablet 0.5 mg  0.5 mg Oral BID Jomarie Longs, MD      . hydrOXYzine (ATARAX/VISTARIL) tablet 50 mg  50 mg Oral TID PRN Jomarie Longs, MD   50 mg at 04/29/15 0823  . lamoTRIgine (LAMICTAL) tablet 25 mg  25 mg Oral Daily Jomarie Longs, MD   25 mg at 04/29/15 0820  . LORazepam (ATIVAN) tablet 1 mg  1 mg Oral Q6H PRN Jomarie Longs, MD   1 mg at 04/28/15 2112   Or  . LORazepam (ATIVAN) injection 1 mg  1 mg Intramuscular Q6H PRN Ehtan Delfavero, MD      . magnesium hydroxide (MILK OF MAGNESIA) suspension 30 mL  30 mL Oral Daily PRN Beau Fanny, FNP      . nicotine polacrilex (NICORETTE) gum 2 mg  2 mg Oral PRN Jomarie Longs, MD   2 mg at 04/29/15 2130  . polymixin-bacitracin (POLYSPORIN) ointment   Topical BID Craige Cotta, MD      . QUEtiapine (SEROQUEL) tablet 50 mg  50 mg Oral QHS PRN,MR X 1 Worthy Flank, NP   50 mg at 04/28/15 2113  . risperiDONE (RISPERDAL M-TABS) disintegrating tablet 0.5 mg  0.5 mg Oral TID Jomarie Longs, MD        Lab Results:  No results found for this or any previous visit (from the past 48 hour(s)).  Physical Findings: AIMS: Facial and Oral Movements Muscles of Facial Expression: None, normal Lips and Perioral Area: None, normal Jaw: None, normal Tongue: None, normal,Extremity Movements Upper (arms, wrists, hands, fingers): None, normal Lower (legs, knees, ankles, toes): None, normal, Trunk Movements Neck, shoulders, hips: None, normal, Overall Severity Severity of abnormal movements (highest score from questions above): None, normal Incapacitation due to abnormal movements: None, normal Patient's awareness of abnormal movements (rate only patient's report): No Awareness, Dental Status Current problems with teeth and/or dentures?: Yes Does patient usually wear dentures?: No  CIWA:   CIWA-Ar Total: 5 COWS:  COWS Total Score: 3  Musculoskeletal: Strength & Muscle Tone: within normal limits Gait & Station: normal Patient leans: N/A  Psychiatric Specialty Exam: Review of Systems  Psychiatric/Behavioral: Positive for substance abuse. The patient is nervous/anxious and has insomnia.   All other systems reviewed and are negative.   Blood pressure 116/83, pulse 97, temperature 97.9 F (36.6 C), temperature source Oral, resp. rate 18, height  (1.727 m), weight 84.46 kg (186 lb 3.2 oz).Body mass index is 28.32 kg/(m^2).  General Appearance: Casual  Eye Contact::  Fair  Speech:  Clear and Coherent  Volume:  Normal  Mood:  Anxious, Hopeless and Irritable improving  Affect:  Labile   Thought Process:  Coherent  Orientation:  Full (Time, Place, and Person)  Thought Content:  Paranoid Ideation and Rumination  Suicidal Thoughts:  No  Homicidal Thoughts:  No  Memory:  Immediate;   Fair Recent;   Fair Remote;   Fair  Judgement:  Impaired  Insight:  Shallow  Psychomotor Activity:  Restlessness  Concentration:  Fair  Recall:  [employer] of Knowledge:Fair  Language: Fair  Akathisia:  No  Handed:  Right  AIMS (if indicated):     Assets:  Manufacturing systems engineer Physical Health Social Support Talents/Skills  ADL's:  Intact  Cognition: WNL  Sleep:  Number of Hours: 4.25   Treatment Plan Summary:Jaime Shannon is a 73 y old CM , married ,who has a hx of Bipolar do , PTSD , cannabis abuse , who is noncompliant on medications , presents with mood lability, panic attacks , sleep issues . Pt continues to have anxiety sx and panic attacks on the unit, however is progressing . Will readjust medications. Will continue treatment.  Daily contact with patient to assess and evaluate symptoms and progress in treatment and Medication management Will discontinue Serqoeul due to side effect concerns. Pt does not want to be on another antipsychotic scheduled  at this time.Will continue to  provide medication education. Will continue  Lamictal 25 mg po daily for mood sx. Will add Risperidone M tab 0.5 mg po tid for mood lability/psychosis. Will add Cogentin 0.5 mg po bid for EPS. Will make available ativan 1 mg po/IM PRN for panic sx , severe agitation. Will consider CBT for PTSD sx, panic sx. Will continue Vistaril 50 mg po tid prn for anxiety sx. Will make available PRN medications as per agitation protocol. Will continue to monitor vitals ,medication compliance and treatment side effects while patient is here.  Will monitor for medical issues as well as call consult as needed.  Reviewed labs ,cbc - wnl, cmp - wnl, lipid panel - elevated - will consult dietician, TSH - wnl , UDS -,pos for cannabinoids , BAL<5 ,EKG - qtc - wnl,  Hba1c- wnl , PL - elevated - will recommend outpt monitoring. CSW will start working on disposition.  Patient to participate in therapeutic milieu .     Ladaja Yusupov MD 04/29/2015, 11:34 AM

## 2015-04-29 NOTE — [facility] Group Notes (Signed)
[facility] LCSW Aftercare Discharge Planning Group Note   04/29/2015 9:22 AM  Participation Quality:  Engaged  Mood/Affect:  Appropriate  Depression Rating:  denies  Anxiety Rating:  5-states this is baseline  Thoughts of Suicide:  No Will you contract for safety?   NA  Current AVH:  No  Plan for Discharge/Comments:  "I feel amazing today.  I feel rested, I have a great appetite, the food here is wonderful, and I enjoy the people very much."  No complaints.  States his anxiety is "controlled." [employer]:   Supports:  [place], Anthony B

## 2015-04-29 NOTE — Progress Notes (Signed)
D: Pt denies SI/HI/AVH. Pt is pleasant and cooperative. Pt stated he was doing ok tonight , but had panic attack earlier in the day. Pt stated he was improving and using coping skills learned to help during anxiety attacks.   A: Pt was offered support and encouragement. Pt was given scheduled medications. Pt was encourage to attend groups. Q 15 minute checks were done for safety.   R:Pt attends groups and interacts well with peers and staff. Pt is taking medication. Pt has no complaints at this time .Pt receptive to treatment and safety maintained on unit.

## 2015-04-29 NOTE — [facility] Group Notes (Signed)
[facility] LCSW Group Therapy  04/29/2015  1:05 PM  Type of Therapy:  Group therapy  Participation Level:  Active  Participation Quality:  Attentive  Affect:  Flat  Cognitive:  Oriented  Insight:  Limited  Engagement in Therapy:  Limited  Modes of Intervention:  Discussion, Socialization  Summary of Progress/Problems:  Chaplain was here to lead a group on themes of hope and courage. Stayed for 3/4 of the group.  Declined to participate.  Appeared to be preoccupied.  Daryel Gerald B 04/29/2015 1:32 PM

## 2015-04-29 NOTE — Plan of Care (Signed)
Problem: Alteration in mood Goal: STG-Patient does not harm self or others Outcome: Progressing Pt safe on the unit at this time

## 2015-04-29 NOTE — Progress Notes (Signed)
DAR NOTE: Patient presents with anxious mood and affect. Denies pain, auditory and visual hallucinations.  Rates depression at 0, hopelessness at 0, and anxiety at 5.  Maintained on routine safety checks.  Medications given as prescribed.  Support and encouragement offered as needed.  Attended group and participated.  States goal for today is "don't be so anxious."  Patient observed socializing with peers in the dayroom.  Patient complain of anxiety and palpitation.  Ativan 1 mg given.  Patient encouraged to use coping skills such as deep breathing exercise.  Patient later calm and heart rate reduced to 112 from 149.  Md made aware.

## 2015-04-29 NOTE — Progress Notes (Signed)
Adult Psychoeducational Group Note  Date:  04/29/2015 Time:  9:17 PM  Group Topic/Focus:  Wrap-Up Group:   The focus of this group is to help patients review their daily goal of treatment and discuss progress on daily workbooks.  Participation Level:  Active  Participation Quality:  Appropriate and Attentive  Affect:  Appropriate  Cognitive:  Appropriate  Insight: Appropriate and Good  Engagement in Group:  Engaged  Modes of Intervention:  Discussion  Additional Comments:  Pt rated his day a 9 out of 10. Pt did not have a full anxiety attack. Pt relapse prevention is to think before he react.   Merlinda Frederick 04/29/2015, 9:17 PM

## 2015-04-30 MED ORDER — GUAIFENESIN ER 600 MG PO TB12
600.0000 mg | ORAL_TABLET | Freq: Two times a day (BID) | ORAL | Status: AC
  Administered 2015-04-30 – 2015-05-03 (×6): 600 mg via ORAL
  Filled 2015-04-30 (×7): qty 1

## 2015-04-30 MED ORDER — OXYMETAZOLINE HCL 0.05 % NA SOLN
1.0000 | Freq: Two times a day (BID) | NASAL | Status: AC
  Administered 2015-04-30 – 2015-05-03 (×6): 1 via NASAL
  Filled 2015-04-30 (×2): qty 15

## 2015-04-30 NOTE — Progress Notes (Signed)
D: Pt denies SI/HI/AVH. Pt is pleasant and cooperative. 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   A: Pt was offered support and encouragement. Pt was given scheduled medications. Pt was encourage to attend groups. Q 15 minute checks were done for safety.   R:Pt attends groups and interacts well with peers and staff. Pt is taking medication. Pt has no complaints at this time .Pt receptive to treatment and safety maintained on unit.

## 2015-04-30 NOTE — Progress Notes (Signed)
DAR NOTE: Patient presents with anxious affect and mood.  Denies auditory and visual hallucinations.  Rates depression at 0 hopelessness at 0, and anxiety at 5.  Describes energy level as high and concentration as good.  Maintained on routine safety checks.  Medications given as prescribed.  Support and encouragement offered as needed.  Attended group and participated.  States goal for today is "no panic attack."  Patient observed socializing with peers in the dayroom.  Patient complain about earache when chewing or biting down .  Offered analgesic but refused.  Warm compress given.  Call placed to [facility], RNP.  Patient seen by RNP.  Patient offered Tylenol 650 mg and Ativan 1 mg for severe agitation and anxiety with good effect.

## 2015-04-30 NOTE — Plan of Care (Signed)
Problem: Alteration in mood Goal: LTG-Patient's behavior demonstrates decreased manic symptoms (Patient's behavior demonstrates decreased manic symptoms to the point the patient is safe to return home and continue treatment in an outpatient setting)  Outcome: Progressing Pt observed on unit sitting and interacting with peers in the dayroom, pt not pacing the halls this evening or complaining of having a panic attack.

## 2015-04-30 NOTE — [facility] Group Notes (Signed)
[facility] Group Notes:  (Clinical Social Work)  04/30/2015  11:15-12:00PM  Summary of Progress/Problems:   Today's process group involved patients discussing their feelings related to being hospitalized, as well as how they can use their present feelings to create a plan for discharge and how to stay well.  There was considerable discussion about the benefit of being with others with similar problems and no longer feeling alone.  There was also discussion about boundary setting with people who are judgmental. The patient expressed that he feels good about being in the hospital, that he needed the help.  He stated there is no judgment, and so he feels safe.  He has had fun with other patients.  He has felt support and comfort from the sameness that he has experienced.  Type of Therapy:  Group Therapy - Process  Participation Level:  Active  Participation Quality:  Attentive, Sharing and Supportive  Affect:  Blunted  Cognitive:  Appropriate  Insight:  Engaged  Engagement in Therapy:  Engaged  Modes of Intervention:  Exploration, Discussion  Ambrose Mantle, LCSW 04/30/2015, 1:29 PM

## 2015-04-30 NOTE — Progress Notes (Signed)
[facility] MD Progress Note  04/30/2015 6:32 PM CRISTOBAL ADVANI  MRN:  [id number] Subjective:  Patient states " I feel better overall but my ear hurts so bad. "  Objective:: Yovanni is a 35 y old CM , married , employed as a Investment banker, operational , lives with his wife in [place] , has a past hx of Bipolar disorder, PTSD, noncompliant with treatment. Pt per initial notes in EHR presented to [facility] with c/o wanting to get help, since his family wanted him IVCed. Pt recently was seen at [facility] on 04/06/15 for SI , and was discharged with out pt referral.  Pt seen and chart reviewed. Pt is alert/oriented x4, calm, cooperative, and appropriate to situation. Pt denies suicidal/homicidal ideation and psychosis and does not appear to be responding to internal stimuli. Pt continues to present with some anxiety although this is improving. He reports that his right ear feels "stopped up and painful". Examined the ear and tympanic membrane is pearly gray and translucent without signs of exudate or air/fluid level. WNL at this time and will continue to monitor; suspect a mildly obstructed Eustachean tube and will treat as below.      Principal Problem: Bipolar disorder, curr episode mixed, severe, with psychotic features (HCC) Diagnosis:   Patient Active Problem List   Diagnosis Date Noted  . Panic disorder [F41.0] 04/27/2015  . Bipolar disorder, curr episode mixed, severe, with psychotic features (HCC) [F31.64] 04/26/2015  . Cannabis use disorder, severe, dependence (HCC) [F12.20] 04/26/2015   Total Time spent with patient: 30 minutes  Past Psychiatric History: Pt reports a hx of Bipolar do, PTSD. Denies any out patient treatment at this time. Was seen at [facility] recently , was discharged with out pt referral.Pt does have hx of previous inpatient hospitalizations in the past.Denies suicide attempts.    Past Medical History:  Past Medical History  Diagnosis Date  . Kidney stone   . Bipolar 1 disorder [facility])     Past Surgical History   Procedure Laterality Date  . Abdominal surgery    . Surgery scrotal / testicular     Family History:  Family History  Problem Relation Age of Onset  . Bipolar disorder Father   . Alcoholism Father   . Bipolar disorder Other   . Heart Problems Maternal Grandfather    Family Psychiatric  History: Pt 's dad has bipolar do. Bipolar do runs in his father's side of family. Pt reports that his father abused alcohol. Social History: Pt is employed as a Investment banker, operational , lives in [place] with his wife. Has a 65 yr old daughter , shared custody with ex wife , he gets her every Tuesday. Pt denies pending legal issues  History  Alcohol Use  . Yes     History  Drug Use  . Yes  . Special: Marijuana    Comment: every day for two weeks    Social History   Social History  . Marital Status: Married    Spouse Name: N/A  . Number of Children: N/A  . Years of Education: N/A   Social History Main Topics  . Smoking status: Current Every Day Smoker  . Smokeless tobacco: None  . Alcohol Use: Yes  . Drug Use: Yes    Special: Marijuana     Comment: every day for two weeks  . Sexual Activity: Not Asked   Other Topics Concern  . None   Social History Narrative   Additional Social History:    Pain Medications: Denies  Prescriptions: Denies  Over the Counter: Denies  History of alcohol / drug use?: Yes Longest period of sobriety (when/how long): unknown  Negative Consequences of Use: Financial Withdrawal Symptoms: Agitation, Irritability Name of Substance 1: Marijauna  1 - Age of First Use: 35 years old 1 - Amount (size/oz): 40-50$ worth a day 1 - Frequency: daily 1 - Duration: 10 years 1 - Last Use / Amount: a week ago                  Sleep: Good   Appetite:  Good  Current Medications: Current Facility-Administered Medications  Medication Dose Route Frequency Provider Last Rate Last Dose  . acetaminophen (TYLENOL) tablet 650 mg  650 mg Oral Q6H PRN Beau Fanny, FNP   650 mg  at 04/30/15 1654  . alum & mag hydroxide-simeth (MAALOX/MYLANTA) 200-200-20 MG/5ML suspension 30 mL  30 mL Oral Q4H PRN Beau Fanny, FNP      . benztropine (COGENTIN) tablet 0.5 mg  0.5 mg Oral BID Jomarie Longs, MD   0.5 mg at 04/30/15 1721  . guaiFENesin (MUCINEX) 12 hr tablet 600 mg  600 mg Oral BID Beau Fanny, FNP      . hydrOXYzine (ATARAX/VISTARIL) tablet 50 mg  50 mg Oral TID PRN Jomarie Longs, MD   50 mg at 04/29/15 2124  . lamoTRIgine (LAMICTAL) tablet 25 mg  25 mg Oral Daily Jomarie Longs, MD   25 mg at 04/30/15 0842  . LORazepam (ATIVAN) tablet 1 mg  1 mg Oral Q6H PRN Jomarie Longs, MD   1 mg at 04/30/15 1654   Or  . LORazepam (ATIVAN) injection 1 mg  1 mg Intramuscular Q6H PRN Saramma Eappen, MD      . magnesium hydroxide (MILK OF MAGNESIA) suspension 30 mL  30 mL Oral Daily PRN Beau Fanny, FNP      . nicotine polacrilex (NICORETTE) gum 2 mg  2 mg Oral PRN Jomarie Longs, MD   2 mg at 04/30/15 0844  . oxymetazoline (AFRIN) 0.05 % nasal spray 1 spray  1 spray Each Nare BID Beau Fanny, FNP      . polymixin-bacitracin (POLYSPORIN) ointment   Topical BID Craige Cotta, MD      . QUEtiapine (SEROQUEL) tablet 50 mg  50 mg Oral QHS PRN,MR X 1 Worthy Flank, NP   50 mg at 04/29/15 2255  . risperiDONE (RISPERDAL M-TABS) disintegrating tablet 0.5 mg  0.5 mg Oral TID Jomarie Longs, MD   0.5 mg at 04/30/15 1732    Lab Results:  No results found for this or any previous visit (from the past 48 hour(s)).  Physical Findings: AIMS: Facial and Oral Movements Muscles of Facial Expression: None, normal Lips and Perioral Area: None, normal Jaw: None, normal Tongue: None, normal,Extremity Movements Upper (arms, wrists, hands, fingers): None, normal Lower (legs, knees, ankles, toes): None, normal, Trunk Movements Neck, shoulders, hips: None, normal, Overall Severity Severity of abnormal movements (highest score from questions above): None, normal Incapacitation due to  abnormal movements: None, normal Patient's awareness of abnormal movements (rate only patient's report): No Awareness, Dental Status Current problems with teeth and/or dentures?: Yes Does patient usually wear dentures?: No  CIWA:  CIWA-Ar Total: 5 COWS:  COWS Total Score: 3  Musculoskeletal: Strength & Muscle Tone: within normal limits Gait & Station: normal Patient leans: N/A  Psychiatric Specialty Exam: Review of Systems  Psychiatric/Behavioral: Positive for substance abuse. The patient is nervous/anxious and has insomnia.  All other systems reviewed and are negative.   Blood pressure 118/92, pulse 98, temperature 98.8 F (37.1 C), temperature source Oral, resp. rate 16, height  (1.727 m), weight 84.46 kg (186 lb 3.2 oz).Body mass index is 28.32 kg/(m^2).  General Appearance: Casual  Eye Contact::  Fair  Speech:  Clear and Coherent  Volume:  Normal  Mood:  Anxious, Hopeless and Irritable improving  Affect:  Labile   Thought Process:  Coherent  Orientation:  Full (Time, Place, and Person)  Thought Content:  Rumination  Suicidal Thoughts:  No  Homicidal Thoughts:  No  Memory:  Immediate;   Fair Recent;   Fair Remote;   Fair  Judgement:  Impaired  Insight:  Shallow  Psychomotor Activity:  Restlessness  Concentration:  Fair  Recall:  [employer] of Knowledge:Fair  Language: Fair  Akathisia:  No  Handed:  Right  AIMS (if indicated):     Assets:  Manufacturing systems engineer Physical Health Social Support Talents/Skills  ADL's:  Intact  Cognition: WNL  Sleep:  Number of Hours: 5.75   Treatment Plan Summary:Tryston is a 54 y old CM , married ,who has a hx of Bipolar do , PTSD , cannabis abuse , who is noncompliant on medications , presents with mood lability, panic attacks , sleep issues . Today on 04/29/14, pt continues to improve in terms of anxiety and depression although complaints of ear pain.   Daily contact with patient to assess and evaluate symptoms and progress in  treatment and Medication management Will discontinue Serqoeul due to side effect concerns. Pt does not want to be on another antipsychotic scheduled  at this time.Will continue to provide medication education. Will continue  Lamictal 25 mg po daily for mood sx. Will add Risperidone M tab 0.5 mg po tid for mood lability/psychosis. Will add Cogentin 0.5 mg po bid for EPS. Will make available ativan 1 mg po/IM PRN for panic sx , severe agitation. Will consider CBT for PTSD sx, panic sx. Will continue Vistaril 50 mg po tid prn for anxiety sx. Will make available PRN medications as per agitation protocol. Will continue to monitor vitals ,medication compliance and treatment side effects while patient is here.  Will monitor for medical issues as well as call consult as needed.  -Add Afrin nasal spray bilateral nares bid x 3 days -Mucinex  q12h x 3 days  Reviewed labs ,cbc - wnl, cmp - wnl, lipid panel - elevated - will consult dietician, TSH - wnl , UDS -,pos for cannabinoids , BAL<5 ,EKG - qtc - wnl,  Hba1c- wnl , PL - elevated - will recommend outpt monitoring. CSW will start working on disposition.  Patient to participate in therapeutic milieu .   Beau Fanny, FNP-BC 04/30/2015, 6:32 PM I agree with assessment and plan Madie Reno A. Dub Mikes, M.D.

## 2015-05-01 NOTE — Progress Notes (Signed)
D: Pt presents anxious on approach. Pt denies auditory and visual hallucinations. Pt denies suicidal thoughts. Pt noted to have delayed responses during shift assessment. Pt rates anxiety 5/10. Pt c/o right ear pain. Pt stated that the nasal spray is helping and appears to be decreasing the pressure in his ear. Pt compliant with taking meds, no adverse reaction to meds verbalized by pt. Pt verbalized that he's ready for discharge.  A: Medications reviewed with pt. Medications administered as ordered per MD. Verbal support provided. Pt encouraged to attend groups. 15 minute checks performed for safety.  R: Pt stated goal is to  Don't get worked up to have anxiety attack. Pt receptive to tx.

## 2015-05-01 NOTE — [facility] Group Notes (Signed)
[facility] Group Notes:  (Clinical Social Work)  05/01/2015  [facility] Group Notes:  (Clinical Social Work)  05/01/2015  11:00AM-12:00PM  Summary of Progress/Problems:  The main focus of today's process group was to listen to a variety of genres of music and to identify that different types of music provoke different responses.  The patient then was able to identify personally what was soothing for them, as well as energizing.  The patient expressed understanding of concepts, as well as knowledge of how each type of music affected him and how this can be used at home as a wellness/recovery tool.  He identified his overall mood prior to group as tranquil and relaxed, and then afterward said he really felt nothing.  Type of Therapy:  Music Therapy   Participation Level:  Active  Participation Quality:  Attentive and Sharing  Affect:  Blunted  Cognitive:  Oriented  Insight:  Engaged  Engagement in Therapy:  Engaged  Modes of Intervention:   Activity, Exploration  Ambrose Mantle, LCSW 05/01/2015

## 2015-05-01 NOTE — Progress Notes (Signed)
Adult Psychoeducational Group Note  Date:  05/01/2015 Time:  8:33 PM  Group Topic/Focus:  Wrap-Up Group:   The focus of this group is to help patients review their daily goal of treatment and discuss progress on daily workbooks.  Participation Level:  Active  Participation Quality:  Appropriate  Affect:  Appropriate  Cognitive:  Appropriate  Insight: Appropriate  Engagement in Group:  Engaged  Modes of Intervention:  Discussion  Additional Comments: The patient expressed that he rates his day a 10 which is good.The patient also said that his support system is his family  and friends.  Octavio Manns 05/01/2015, 8:33 PM

## 2015-05-01 NOTE — Progress Notes (Signed)
[facility] MD Progress Note  05/01/2015 2:10 PM [place]  MRN:  [id number] Subjective:  Patient states " I feel better overall but my ear hurts so bad. "  Objective:: [name] is a 35 y old CM , married , employed as a Investment banker, operational , lives with his wife in [place] , has a past hx of Bipolar disorder, PTSD, noncompliant with treatment. Pt per initial notes in EHR presented to [facility] with c/o wanting to get help, since his family wanted him IVCed. Pt recently was seen at [facility] on 04/06/15 for SI , and was discharged with out pt referral.  Pt seen and chart reviewed. Pt is alert/oriented x4, calm, cooperative, and appropriate to situation. Pt denies suicidal/homicidal ideation and psychosis and does not appear to be responding to internal stimuli. Pt reports that he continues to improve in regard to anxiety/depression rating it is minimal and with good efficacy from current medication regimen. Pt reports that his ear continues to hurt. Examined again and both ears are WNL with no notable change from yesterday. Suspecting possible fluid pocket from obstructed Eustachean tube and will add Ibuprofen  q6h prn pain/inflammation.    Principal Problem: Bipolar disorder, curr episode mixed, severe, with psychotic features (HCC) Diagnosis:   Patient Active Problem List   Diagnosis Date Noted  . Panic disorder [F41.0] 04/27/2015  . Bipolar disorder, curr episode mixed, severe, with psychotic features (HCC) [F31.64] 04/26/2015  . Cannabis use disorder, severe, dependence (HCC) [F12.20] 04/26/2015   Total Time spent with patient: 15 minutes  Past Psychiatric History: Pt reports a hx of Bipolar do, PTSD. Denies any out patient treatment at this time. Was seen at [facility] recently , was discharged with out pt referral.Pt does have hx of previous inpatient hospitalizations in the past.Denies suicide attempts.  Past Medical History:  Past Medical History  Diagnosis Date  . Kidney stone   . Bipolar 1 disorder [facility])      Past Surgical History  Procedure Laterality Date  . Abdominal surgery    . Surgery scrotal / testicular     Family History:  Family History  Problem Relation Age of Onset  . Bipolar disorder Father   . Alcoholism Father   . Bipolar disorder Other   . Heart Problems Maternal Grandfather    Family Psychiatric  History: Pt 's dad has bipolar do. Bipolar do runs in his father's side of family. Pt reports that his father abused alcohol. Social History: Pt is employed as a Investment banker, operational , lives in [place] with his wife. Has a 35 yr old daughter , shared custody with ex wife , he gets her every Tuesday. Pt denies pending legal issues  History  Alcohol Use  . Yes     History  Drug Use  . Yes  . Special: Marijuana    Comment: every day for two weeks    Social History   Social History  . Marital Status: Married    Spouse Name: N/A  . Number of Children: N/A  . Years of Education: N/A   Social History Main Topics  . Smoking status: Current Every Day Smoker  . Smokeless tobacco: None  . Alcohol Use: Yes  . Drug Use: Yes    Special: Marijuana     Comment: every day for two weeks  . Sexual Activity: Not Asked   Other Topics Concern  . None   Social History Narrative   Additional Social History:    Pain Medications: Denies  Prescriptions: Denies  Over the Counter: Denies  History of alcohol / drug use?: Yes Longest period of sobriety (when/how long): unknown  Negative Consequences of Use: Financial Withdrawal Symptoms: Agitation, Irritability Name of Substance 1: Marijauna  1 - Age of First Use: 35 years old 1 - Amount (size/oz): 40-50$ worth a day 1 - Frequency: daily 1 - Duration: 10 years 1 - Last Use / Amount: a week ago                  Sleep: Good   Appetite:  Good  Current Medications: Current Facility-Administered Medications  Medication Dose Route Frequency Provider Last Rate Last Dose  . acetaminophen (TYLENOL) tablet 650 mg  650 mg Oral Q6H PRN Beau Fanny, FNP   650 mg at 04/30/15 1654  . alum & mag hydroxide-simeth (MAALOX/MYLANTA) 200-200-20 MG/5ML suspension 30 mL  30 mL Oral Q4H PRN Beau Fanny, FNP      . benztropine (COGENTIN) tablet 0.5 mg  0.5 mg Oral BID Jomarie Longs, MD   0.5 mg at 05/01/15 0804  . guaiFENesin (MUCINEX) 12 hr tablet 600 mg  600 mg Oral BID Beau Fanny, FNP   600 mg at 05/01/15 0804  . hydrOXYzine (ATARAX/VISTARIL) tablet 50 mg  50 mg Oral TID PRN Jomarie Longs, MD   50 mg at 04/30/15 2116  . lamoTRIgine (LAMICTAL) tablet 25 mg  25 mg Oral Daily Jomarie Longs, MD   25 mg at 05/01/15 0805  . LORazepam (ATIVAN) tablet 1 mg  1 mg Oral Q6H PRN Jomarie Longs, MD   1 mg at 04/30/15 1654   Or  . LORazepam (ATIVAN) injection 1 mg  1 mg Intramuscular Q6H PRN Saramma Eappen, MD      . magnesium hydroxide (MILK OF MAGNESIA) suspension 30 mL  30 mL Oral Daily PRN Beau Fanny, FNP      . nicotine polacrilex (NICORETTE) gum 2 mg  2 mg Oral PRN Jomarie Longs, MD   2 mg at 04/30/15 0844  . oxymetazoline (AFRIN) 0.05 % nasal spray 1 spray  1 spray Each Nare BID Beau Fanny, FNP   1 spray at 05/01/15 0804  . polymixin-bacitracin (POLYSPORIN) ointment   Topical BID Craige Cotta, MD      . QUEtiapine (SEROQUEL) tablet 50 mg  50 mg Oral QHS PRN,MR X 1 Worthy Flank, NP   50 mg at 04/30/15 2310  . risperiDONE (RISPERDAL M-TABS) disintegrating tablet 0.5 mg  0.5 mg Oral TID Jomarie Longs, MD   0.5 mg at 05/01/15 1215    Lab Results:  No results found for this or any previous visit (from the past 48 hour(s)).  Physical Findings: AIMS: Facial and Oral Movements Muscles of Facial Expression: None, normal Lips and Perioral Area: None, normal Jaw: None, normal Tongue: None, normal,Extremity Movements Upper (arms, wrists, hands, fingers): None, normal Lower (legs, knees, ankles, toes): None, normal, Trunk Movements Neck, shoulders, hips: None, normal, Overall Severity Severity of abnormal movements (highest  score from questions above): None, normal Incapacitation due to abnormal movements: None, normal Patient's awareness of abnormal movements (rate only patient's report): No Awareness, Dental Status Current problems with teeth and/or dentures?: Yes Does patient usually wear dentures?: No  CIWA:  CIWA-Ar Total: 5 COWS:  COWS Total Score: 3  Musculoskeletal: Strength & Muscle Tone: within normal limits Gait & Station: normal Patient leans: N/A  Psychiatric Specialty Exam: Review of Systems  Psychiatric/Behavioral: Positive for substance abuse. The patient is nervous/anxious  and has insomnia.   All other systems reviewed and are negative.   Blood pressure 114/76, pulse 106, temperature 97.8 F (36.6 C), temperature source Oral, resp. rate 20, height  (1.727 m), weight 84.46 kg (186 lb 3.2 oz).Body mass index is 28.32 kg/(m^2).  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 Today on  05/01/2015 , continues to report improvement on current regimen, yet improves ear pain is worsening.  Daily contact with patient to assess and  evaluate symptoms and progress in treatment and Medication management Will discontinue Serqoeul due to side effect concerns. Pt does not want to be on another antipsychotic scheduled  at this time.Will continue to provide medication education. Will continue  Lamictal 25 mg po daily for mood sx. Will add Risperidone M tab 0.5 mg po tid for mood lability/psychosis. Will add Cogentin 0.5 mg po bid for EPS. Will make available ativan 1 mg po/IM PRN for panic sx , severe agitation. Will consider CBT for PTSD sx, panic sx. Will continue Vistaril 50 mg po tid prn for anxiety sx. Will make available PRN medications as per agitation protocol. Will continue to monitor vitals ,medication compliance and treatment side effects while patient is here.  Will monitor for medical issues as well as call consult as needed.  -Add Afrin nasal spray bilateral nares bid x 3 days -Mucinex  q12h x 3 days -Add Ibuprofen  q6h prn pain  Reviewed labs ,cbc - wnl, cmp - wnl, lipid panel - elevated - will consult dietician, TSH - wnl , UDS -,pos for cannabinoids , BAL<5 ,EKG - qtc - wnl,  Hba1c- wnl , PL - elevated - will recommend outpt monitoring. CSW will start working on disposition.  Patient to participate in therapeutic milieu .   Beau Fanny, FNP-BC 05/01/2015, 2:10 PM I agree with assessment and plan Madie Reno A. Dub Mikes, M.D.

## 2015-05-02 NOTE — Progress Notes (Signed)
D: Pt denies SI/HI/AVH. Pt is pleasant and cooperative. Pt stated he was doing very well. Pt appeared better. Pt seen walking the unit, but not in a pacing anxious way. Pt stated he was grateful for coming in , because he got the help he needed.   A: Pt was offered support and encouragement. Pt was given scheduled medications. Pt was encourage to attend groups. Q 15 minute checks were done for safety.   R:Pt attends groups and interacts well with peers and staff. Pt is taking medication. Pt has no complaints.Pt receptive to treatment and safety maintained on unit.

## 2015-05-02 NOTE — Plan of Care (Signed)
Problem: Alteration in mood Goal: LTG-Patient's behavior demonstrates decreased manic symptoms (Patient's behavior demonstrates decreased manic symptoms to the point the patient is safe to return home and continue treatment in an outpatient setting)  Outcome: Progressing Pt appeared calm on the unit and did not appear out of control, pt was observed walking the unit with another patient, but not in a hyper pacing fashion.

## 2015-05-02 NOTE — Progress Notes (Signed)
D:  Patient's self inventory sheet, patient sleeps good, sleep medication was helpful.  Good appetite, high energy level, good concentration.  Denied hopeless and depression.  Rated anxiety #3.  Denied withdrawals.  Denied SI.  Physical problems are R ear.  Pain medication is helpful sometimes.  Worst pain in past 24 hours is #4.  Goal is to work on panic, plans to stay calm. A:  Medications administered per MD orders.  Emotional support and encouragement given patient. R:  Denied SI and HI.  Contracts for safety.  De8ied A/V hallucinations.  Safety maintained with 15 minute checks.

## 2015-05-02 NOTE — Plan of Care (Signed)
Problem: Alteration in mood Goal: LTG-Patient's behavior demonstrates decreased manic symptoms (Patient's behavior demonstrates decreased manic symptoms to the point the patient is safe to return home and continue treatment in an outpatient setting)  Outcome: Progressing Nurse discussed depression/coping skills with patient.     

## 2015-05-02 NOTE — Progress Notes (Signed)
D: Pt informed that his day was "going good".  Stated, "I feel better". Pt states he received some "bad news. My wife doesn't want to be with me, but I'm ok".  Pt informed the writer that he likes the combination of meds he's on at this time. Was initially worried that he would feel "empty inside".  Pt has no other questions or concerns.    A:  Support and encouragement was offered. 15 min checks continued for safety.  R: Pt remains safe.

## 2015-05-02 NOTE — Progress Notes (Signed)
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 Pt per initial notes in EHR presented to [facility] with c/o wanting to get help, since his family wanted him IVCed. Pt recently was seen at [facility] on 04/06/15 for SI , and was discharged with out pt referral.  Pt seen and chart reviewed. Pt is alert/oriented x4, calm, cooperative, and appropriate to situation. Pt today is seen as less anxious , less paranoid , less labile. Pt reports his medications are effective , denies ADRs. Pt continues to have some panic sx on and off - but is able to cope with it better. Pt is trying to use Vistaril as much as he can instead of ativan. Discussed with pt about his recent varying HR . Pt advised to follow up with PMD for his elevated HR - to make sure heart problems are ruled out.  Pt denies SI/HI today. Per staff pt is compliant on medications - denies side effects.   Principal Problem: Bipolar disorder, curr episode mixed, severe, with psychotic features (HCC) Diagnosis:   Patient Active Problem List   Diagnosis Date Noted  . Panic disorder [F41.0] 04/27/2015  . Bipolar disorder, curr episode mixed, severe, with psychotic features (HCC) [F31.64] 04/26/2015  . Cannabis use disorder, severe, dependence (HCC) [F12.20] 04/26/2015   Total Time spent with patient: 25 minutes  Past Psychiatric History: Pt reports a hx of Bipolar do, PTSD. Denies any out patient treatment at this time. Was seen at [facility] recently , was discharged with out pt referral.Pt does have hx of previous inpatient hospitalizations in the past.Denies suicide attempts.  Past Medical History:  Past Medical History  Diagnosis Date  . Kidney stone   . Bipolar 1 disorder [facility])     Past Surgical History   Procedure Laterality Date  . Abdominal surgery    . Surgery scrotal / testicular     Family History:  Family History  Problem Relation Age of Onset  . Bipolar disorder Father   . Alcoholism Father   . Bipolar disorder Other   . Heart Problems Maternal Grandfather    Family Psychiatric  History: Pt 's dad has bipolar do. Bipolar do runs in his father's side of family. Pt reports that his father abused alcohol. 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 Has a 48 yr old daughter , shared custody with ex wife , he gets her every Tuesday. Pt denies pending legal issues  History  Alcohol Use  . Yes     History  Drug Use  . Yes  . Special: Marijuana    Comment: every day for two weeks    Social History   Social History  . Marital Status: Married    Spouse Name: N/A  . Number of Children: N/A  . Years of Education: N/A   Social History Main Topics  . Smoking status: Current Every Day Smoker  . Smokeless tobacco: None  . Alcohol Use: Yes  . Drug Use: Yes    Special: Marijuana     Comment: every day for two weeks  . Sexual Activity: Not Asked   Other Topics Concern  . None   Social  History Narrative   Additional Social History:    Pain Medications: Denies  Prescriptions: Denies  Over the Counter: Denies  History of alcohol / drug use?: Yes Longest period of sobriety (when/how long): unknown  Negative Consequences of Use: Financial Withdrawal Symptoms: Agitation, Irritability Name of Substance 1: Marijauna  1 - Age of First Use: 35 years old 1 - Amount (size/oz): 40-50$ worth a day 1 - Frequency: daily 1 - Duration: 10 years 1 - Last Use / Amount: a week ago                  Sleep: Good   Appetite:  Good  Current Medications: Current Facility-Administered Medications  Medication Dose Route Frequency Provider Last Rate Last Dose  . acetaminophen (TYLENOL) tablet 650 mg  650 mg Oral Q6H PRN Beau Fanny, FNP   650 mg  at 05/01/15 1701  . alum & mag hydroxide-simeth (MAALOX/MYLANTA) 200-200-20 MG/5ML suspension 30 mL  30 mL Oral Q4H PRN Beau Fanny, FNP      . benztropine (COGENTIN) tablet 0.5 mg  0.5 mg Oral BID Jomarie Longs, MD   0.5 mg at 05/02/15 0826  . guaiFENesin (MUCINEX) 12 hr tablet 600 mg  600 mg Oral BID Beau Fanny, FNP   600 mg at 05/02/15 1610  . hydrOXYzine (ATARAX/VISTARIL) tablet 50 mg  50 mg Oral TID PRN Jomarie Longs, MD   50 mg at 05/01/15 2106  . lamoTRIgine (LAMICTAL) tablet 25 mg  25 mg Oral Daily Jomarie Longs, MD   25 mg at 05/02/15 0826  . LORazepam (ATIVAN) tablet 1 mg  1 mg Oral Q6H PRN Jomarie Longs, MD   1 mg at 04/30/15 1654   Or  . LORazepam (ATIVAN) injection 1 mg  1 mg Intramuscular Q6H PRN Saheed Carrington, MD      . magnesium hydroxide (MILK OF MAGNESIA) suspension 30 mL  30 mL Oral Daily PRN Beau Fanny, FNP      . nicotine polacrilex (NICORETTE) gum 2 mg  2 mg Oral PRN Jomarie Longs, MD   2 mg at 05/02/15 0829  . oxymetazoline (AFRIN) 0.05 % nasal spray 1 spray  1 spray Each Nare BID Beau Fanny, FNP   1 spray at 05/02/15 [id number]  . polymixin-bacitracin (POLYSPORIN) ointment   Topical BID Craige Cotta, MD      . QUEtiapine (SEROQUEL) tablet 50 mg  50 mg Oral QHS PRN,MR X 1 Worthy Flank, NP   50 mg at 05/01/15 2106  . risperiDONE (RISPERDAL M-TABS) disintegrating tablet 0.5 mg  0.5 mg Oral TID Jomarie Longs, MD   0.5 mg at 05/02/15 1204    Lab Results:  No results found for this or any previous visit (from the past 48 hour(s)).  Physical Findings: AIMS: Facial and Oral Movements Muscles of Facial Expression: None, normal Lips and Perioral Area: None, normal Jaw: None, normal Tongue: None, normal,Extremity Movements Upper (arms, wrists, hands, fingers): None, normal Lower (legs, knees, ankles, toes): None, normal, Trunk Movements Neck, shoulders, hips: None, normal, Overall Severity Severity of abnormal movements (highest score from questions  above): None, normal Incapacitation due to abnormal movements: None, normal Patient's awareness of abnormal movements (rate only patient's report): No Awareness, Dental Status Current problems with teeth and/or dentures?: Yes Does patient usually wear dentures?: No  CIWA:  CIWA-Ar Total: 1 COWS:  COWS Total Score: 2  Musculoskeletal: Strength & Muscle Tone: within normal limits Gait & Station: normal Patient leans: N/A  Psychiatric Specialty Exam: Review of Systems  Psychiatric/Behavioral: Positive for substance abuse. The patient is nervous/anxious.   All other systems reviewed and are negative.   Blood pressure 128/93, pulse 86, temperature 98 F (36.7 C), temperature source Oral, resp. rate 20, height  (1.727 m), weight 84.46 kg (186 lb 3.2 oz).Body mass index is 28.32 kg/(m^2).  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 Today on  05/02/2015 , continues to report improvement on current regimen.  Daily contact with patient to assess and evaluate symptoms and progress in treatment  and Medication management  Will continue  Lamictal 25 mg po daily for mood sx. Will continue Risperidone M tab 0.5 mg po tid for mood lability/psychosis. Will continue  Cogentin 0.5 mg po bid for EPS. Will make available ativan 1 mg po/IM PRN for panic sx , severe agitation. Will consider CBT for PTSD sx, panic sx. Will continue Vistaril 50 mg po tid prn for anxiety sx. Will make available PRN medications as per agitation protocol. Will continue to monitor vitals ,medication compliance and treatment side effects while patient is here.  Will monitor for medical issues as well as call consult as needed.  Will continue  Afrin nasal spray bilateral nares bid x 3 days Will continue Mucinex  q12h x 3 days Will continue Ibuprofen  q6h prn pain  Reviewed labs ,cbc - wnl, cmp - wnl, lipid panel - elevated - will consult dietician, TSH - wnl , UDS -,pos for cannabinoids , BAL<5 ,EKG - qtc - wnl,  Hba1c- wnl , PL - elevated - will recommend outpt monitoring. CSW will start working on disposition. Pt to be referred for CBT /IOP. Patient to participate in therapeutic milieu .   Zebulen Simonis, MD 05/02/2015, 1:44 PM

## 2015-05-02 NOTE — Progress Notes (Signed)
  [facility] Adult Case Management Discharge Plan :  Will you be returning to the same living situation after discharge:  No. At discharge, do you have transportation home?: Yes,  own vehicle Do you have the ability to pay for your medications: Yes,  insurance  Release of information consent forms completed and in the chart;  Patient's signature needed at discharge.  Patient to Follow up at: Follow-up Information    Follow up with [facility].   Why:  Therapy appt with Alyssa on Monday Feb 13th at 2pm. Medication management appt with Crystal on Thursday Feb 16th. Call office if you need to reschedule. Please bring insurance card to appts.   Contact information:   [address] [place], [place] [address] [phone]      Next level of care provider has access to [facility] Link:no  Safety Planning and Suicide Prevention discussed: Yes,  yes  Have you used any form of tobacco in the last 30 days? (Cigarettes, Smokeless Tobacco, Cigars, and/or Pipes): Yes  Has patient been referred to the Quitline?: Patient refused referral  Patient has been referred for addiction treatment: Yes  Ida Rogue 05/02/2015, 4:34 PM

## 2015-05-02 NOTE — Tx Team (Signed)
Interdisciplinary Treatment Plan Update (Adult)  Date:  05/02/2015 Time Reviewed:  10:50 AM  Progress in Treatment: Attending groups: Yes. Participating in groups: Yes. Taking medication as prescribed:  Yes. Tolerating medication:  Yes. Family/Significant othe contact made:  Yes Patient understands diagnosis:  Yes, as evidenced by seeking help with anxiety and panic. Discussing patient identified problems/goals with staff:  Yes, see initial care plan. Medical problems stabilized or resolved:  Yes Denies suicidal/homicidal ideation: Yes. Issues/concerns per patient self-inventory: No. Other:  New problem(s) identified:   Discharge Plan or Barriers: See below  Reason for Continuation of Hospitalization:   Comments: Jaime Shannon is a 51 y old CM , married , employed as a Biomedical scientist , lives with his wife in [place] , has a past hx of Bipolar disorder, PTSD, noncompliant with treatment. Pt per initial notes in EHR presented to [facility] with c/o wanting to get help, since his family wanted him IVCed. Pt recently was seen at [facility] on 04/06/15 for SI , and was discharged with out pt referral. Lamictal, Seroquel trial 04/29/15; Risperdal TID substituted for Seroquel  Estimated length of stay: Likely d/c tomorrow  New goal(s):  Review of initial/current patient goals per problem list:  1. Goal(s): Patient will participate in aftercare plan  Met:Yes   Target date: at discharge  As evidenced by: Patient will participate within aftercare plan AEB aftercare provider and housing plan at discharge being identified.  04/27/15: Pt will return home and follow-up outpt.  2. Goal (s): Patient will exhibit decreased depressive symptoms and suicidal ideations.  Met:Yes   Target date: at discharge  As evidenced by: Patient will utilize self rating of depression at 3 or below and demonstrate decreased signs of depression or be deemed stable for discharge by MD.  04/27/15: Pt denies SI and rates depression as  0/10.  3. Goal(s): Patient will demonstrate decreased signs and symptoms of anxiety.  Met: No   Target date: at discharge  As evidenced by: Patient will utilize self rating of anxiety at 3 or below and demonstrated decreased signs of anxiety, or be deemed stable for discharge by MD  04/27/15: Pt rates anxiety as 8/10 today. 05/02/15:  Rates anxiety at a 3 today; is very happy with his current medication regimen.  4. Goal(s): Patient will demonstrate decreased signs of psychosis.  Met: Yes  Target date:at discharge  As evidenced by: Patient will demonstrate decreased signs of psychosis as evidenced by a reduction in AVH, paranoia, and/or delusions.   04/27/15: Pt denies AVH.  Attendees: Patient:  05/02/2015 10:50 AM  Family:   05/02/2015 10:50 AM  Physician:  Dr. Ursula Alert, MD 05/02/2015 10:50 AM  Nursing: Manuella Ghazi, RN 05/02/2015 10:50 AM  Case Manager:  Roque Lias, LCSW 05/02/2015 10:50 AM  Counselor:  Matthew Saras, MSW Intern 05/02/2015 10:50 AM  Other:   05/02/2015 10:50 AM  Other:   05/02/2015 10:50 AM  Other:   05/02/2015 10:50 AM  Other:  05/02/2015 10:50 AM  Other:    Other:    Other:    Other:    Other:    Other:      Scribe for Treatment Team:   Georga Kaufmann, MSW Intern 05/02/2015 10:50 AM

## 2015-05-02 NOTE — Progress Notes (Signed)
Adult Psychoeducational Group Note  Date:  05/02/2015 Time:  9:15 PM  Group Topic/Focus:  Wrap-Up Group:   The focus of this group is to help patients review their daily goal of treatment and discuss progress on daily workbooks.  Participation Level:  Active  Participation Quality:  Appropriate  Affect:  Appropriate  Cognitive:  Alert  Insight: Appropriate  Engagement in Group:  Engaged  Modes of Intervention:  Discussion  Additional Comments:  Patient goal for today was to not be anxious about everything. On a scale from 1-10, (1=worse, 10= best) patient rated his day as a 10.  Damiyah Ditmars L Fransico Sciandra 05/02/2015, 9:15 PM

## 2015-05-02 NOTE — [facility] Group Notes (Signed)
[facility] LCSW Group Therapy  05/02/2015 3:12 PM   Type of Therapy:  Group Therapy  Participation Level:  Active  Participation Quality:  Attentive  Affect:  Appropriate  Cognitive:  Appropriate  Insight:  Improving  Engagement in Therapy:  Engaged  Modes of Intervention:  Clarification, Education, Exploration and Socialization  Summary of Progress/Problems: Today's group focused on relapse prevention.  We defined the term, and then brainstormed on ways to prevent relapse.  Martavius stayed the entire time and was engaged throughout.  Talked about resilience in the context of skateboarding when he was young.  Despite all the injuries and the cost of replacing his deck regularly, he stuck with it until he aged out.  Attributed this to wanting to make it big like his heroes, as well as a stubborn streak that made him want to show others that he could accomplish something no matter what they thought.    Daryel Gerald B 05/02/2015 , 3:12 PM

## 2015-05-03 MED ORDER — DOUBLE ANTIBIOTIC 500-10000 UNIT/GM EX OINT
1.0000 | TOPICAL_OINTMENT | Freq: Two times a day (BID) | CUTANEOUS | Status: DC
Start: 2015-05-03 — End: 2015-05-11

## 2015-05-03 MED ORDER — HYDROXYZINE HCL 50 MG PO TABS: 50.0000 mg | ORAL_TABLET | Freq: Three times a day (TID) | ORAL | Status: DC | PRN

## 2015-05-03 MED ORDER — LAMOTRIGINE 25 MG PO TABS: 25.0000 mg | ORAL_TABLET | Freq: Every day | ORAL | Status: DC

## 2015-05-03 MED ORDER — QUETIAPINE FUMARATE 50 MG PO TABS: 50.0000 mg | ORAL_TABLET | Freq: Every evening | ORAL | Status: DC | PRN

## 2015-05-03 MED ORDER — BENZTROPINE MESYLATE 0.5 MG PO TABS: 0.5000 mg | ORAL_TABLET | Freq: Two times a day (BID) | ORAL | Status: DC

## 2015-05-03 MED ORDER — RISPERIDONE 0.5 MG PO TBDP: 0.5000 mg | ORAL_TABLET | Freq: Three times a day (TID) | ORAL | Status: DC

## 2015-05-03 MED ORDER — NICOTINE POLACRILEX 2 MG MT GUM: 2.0000 mg | CHEWING_GUM | OROMUCOSAL | Status: AC | PRN

## 2015-05-03 NOTE — Progress Notes (Signed)
Patient ID: Jaime Shannon, male   DOB: Oct 11, 1980, 35 y.o.   MRN: [id number] PT FOLLOW UP PCP APPT MADE AT [facility] IN [place], Wednesday  FEB 1ST AT 10:30 WITH DR. ERIC SONNENBERG.  Sondra Barges, RN

## 2015-05-03 NOTE — [facility] Group Notes (Signed)

## 2015-05-03 NOTE — [facility] Suicide Risk Assessment (Signed)
[facility] Discharge Suicide Risk Assessment   Principal Problem: Bipolar disorder, curr episode mixed, severe, with psychotic features [facility]) Discharge Diagnoses:  Patient Active Problem List   Diagnosis Date Noted  . Panic disorder [F41.0] 04/27/2015  . Bipolar disorder, curr episode mixed, severe, with psychotic features (HCC) [F31.64] 04/26/2015  . Cannabis use disorder, severe, dependence (HCC) [F12.20] 04/26/2015    Total Time spent with patient: 30 minutes  Musculoskeletal: Strength & Muscle Tone: within normal limits Gait & Station: normal Patient leans: N/A  Psychiatric Specialty Exam: Review of Systems  Psychiatric/Behavioral: Positive for substance abuse. Negative for depression and suicidal ideas. The patient is nervous/anxious (anxious - improving , is anxious about DC).   All other systems reviewed and are negative.   Blood pressure 130/90, pulse 80, temperature 97.7 F (36.5 C), temperature source Oral, resp. rate 18, height  (1.727 m), weight 84.46 kg (186 lb 3.2 oz).Body mass index is 28.32 kg/(m^2).  General Appearance: Casual  Eye Contact::  Fair  Speech:  Clear and Coherent409  Volume:  Normal  Mood:  Anxious  Affect:  Appropriate  Thought Process:  Coherent  Orientation:  Full (Time, Place, and Person)  Thought Content:  WDL  Suicidal Thoughts:  No  Homicidal Thoughts:  No  Memory:  Immediate;   Fair Recent;   Fair Remote;   Fair  Judgement:  Fair  Insight:  Fair  Psychomotor Activity:  Normal  Concentration:  Fair  Recall:  [employer] of Knowledge:Fair  Language: Fair  Akathisia:  No  Handed:  Right  AIMS (if indicated):     Assets:  Desire for Improvement  Sleep:  Number of Hours: 6.25  Cognition: WNL  ADL's:  Intact   Mental Status Per Nursing Assessment::   On Admission:     Demographic Factors:  Male and Caucasian  Loss Factors: Relational problems with wife.  Historical Factors: Impulsivity  Risk Reduction Factors:   Employed  and Positive social support  Continued Clinical Symptoms:  Alcohol/Substance Abuse/Dependencies Previous Psychiatric Diagnoses and Treatments  Cognitive Features That Contribute To Risk:  Polarized thinking    Suicide Risk:  Minimal: No identifiable suicidal ideation.  Patients presenting with no risk factors but with morbid ruminations; may be classified as minimal risk based on the severity of the depressive symptoms  Follow-up Information    Follow up with [facility].   Why:  Therapy appt with Alyssa on Monday Feb 13th at 2pm. Medication management appt with Crystal on Thursday Feb 16th. Call office if you need to reschedule. Please bring insurance card to appts.   Contact information:   [address] [place], [place] [address] [phone]      Plan Of Care/Follow-up recommendations:  Activity:  No restrictions Diet:  regular Tests:  as needed Other:  follow up with after care  Maimuna Leaman, MD 05/03/2015, 9:51 AM

## 2015-05-03 NOTE — Progress Notes (Signed)
Discharge Note:  Patient discharged home with family member.   Patient denied SI and HI.  Denied A/V hallucinations.  Denied pain.  Suicide prevention information given and discussed with patient who stated he understood and had no questions.  Patient stated he received all his belongings, clothing, toiletries, misc items, prescriptions, lighters, shoes, etc.  Patient stated he appreciated all assistance received from [facility] staff.  All discharge information given to patient.

## 2015-05-03 NOTE — Progress Notes (Signed)
D:  Patient's self inventory sheet, patient sleeps good, sleep medication is helpful.  Good appetite, high energy level, good concentration.   Denied depression and hopeless.  Rated anxiety #2.  Denied withdrawals.  Denied SI.  Denied physical problems.  Denied pain.  Goal is to stay calm and no anxiety.  Plans to think positive.  Does have discharge plans. A:  Medications administered per MD orders.  Emotional support and encouragement given patient. R:  Patient denied SI and HI, contracts for safety.  Patient denied A/V hallucinations.  Safety maintained with 15 minute checks.

## 2015-05-03 NOTE — Discharge Summary (Signed)
Physician Discharge Summary Note  Patient:  Jaime Shannon is an 35 y.o., male  MRN:  [id number]  DOB:  October 14, 1980  Patient phone:  [phone] (home)   Patient address:   [address] [place] [address],   Total Time spent with patient: Greater than 30 minutes  Date of Admission:  04/25/2015  Date of Discharge: 05-03-15  Reason for Admission: Worsening symptoms of Bipolar disorder  Principal Problem: Bipolar disorder, curr episode mixed, severe, with psychotic features [facility])  Discharge Diagnoses: Patient Active Problem List   Diagnosis Date Noted  . Panic disorder [F41.0] 04/27/2015  . Bipolar disorder, curr episode mixed, severe, with psychotic features (HCC) [F31.64] 04/26/2015  . Cannabis use disorder, severe, dependence (HCC) [F12.20] 04/26/2015   Past Psychiatric History: Bipolar affective disorder  Past Medical History:  Past Medical History  Diagnosis Date  . Kidney stone   . Bipolar 1 disorder [facility])     Past Surgical History  Procedure Laterality Date  . Abdominal surgery    . Surgery scrotal / testicular     Family History:  Family History  Problem Relation Age of Onset  . Bipolar disorder Father   . Alcoholism Father   . Bipolar disorder Other   . Heart Problems Maternal Grandfather    Family Psychiatric  History: See Md's SRA  Social History:  History  Alcohol Use  . Yes     History  Drug Use  . Yes  . Special: Marijuana    Comment: every day for two weeks    Social History   Social History  . Marital Status: Married    Spouse Name: N/A  . Number of Children: N/A  . Years of Education: N/A   Social History Main Topics  . Smoking status: Current Every Day Smoker  . Smokeless tobacco: None  . Alcohol Use: Yes  . Drug Use: Yes    Special: Marijuana     Comment: every day for two weeks  . Sexual Activity: Not Asked   Other Topics Concern  . None   Social History Narrative   Hospital Course: Jaime Shannon is a 18 y old CM,  married, employed as a Investment banker, operational, lives with his wife in [place], has a past hx of Bipolar disorder, PTSD, noncompliant with treatment. Pt per initial notes in EHR presented to [facility] with c/o wanting to get help, since his family wanted him IVCed. Pt recently was seen at [facility] on 04/06/15 for SI , and was discharged with outpatient referral.  Jaime Shannon was admitted to the [facility] with worsening symptoms of Bipolar affective disorder. He has a history PTSD. He used cannabis. During his hospital stay, Jaime Shannon was evaluated. His presenting symptoms were identified. The medication regimen targeting those symptoms were discussed with Jaime Shannon, then initiated. He was started on; Lamictal 25 mg for mood stabilization, Benztropine 0.5 mg for EPS, Seroquel 50 mg for mood control/insomnia & Risperdal 0.5 mg disintegrating for anxiety/mood control.  Jaime Shannon was medicated and discharged on 2 separate antipsychotic medicines because his symptoms (anxiety/insomnia) did not respond well under an antipsychotic monotherapy. Therefore, he is currently on Risperdal disintegrating tables 0.5 mg three times a day to manage his anxiety/mood control & Seroquel 50 mg at bedtime for mood control/insomnia. As of now, it is necessary for Jaime Shannon to continue these combination therapies to achieve maximum symptoms control. However, in the event that his symptoms happen to improve or decrease, he can then be titrated down to an antipsychotic monotherapy.This has to be  done within the proper evaluation & judgement of his outpatient provider.   With the above treatment regimen, Jaime Shannon's symptoms are now stabilized. He is medically & mentally stable to be discharged to his home to follow-up care on an outpatient basis as noted below. He is provided with all the necessary information needed to make this appointment without problems. Upon discharge, he adamantly denies any SIHI, AVH, delusional thoughts & or paranoia. Jaime Shannon left [facility] with all personal belongings in no  apparent distress. Transportation per self.  Physical Findings:  AIMS: Facial and Oral Movements Muscles of Facial Expression: None, normal Lips and Perioral Area: None, normal Jaw: None, normal Tongue: None, normal,Extremity Movements Upper (arms, wrists, hands, fingers): None, normal Lower (legs, knees, ankles, toes): None, normal, Trunk Movements Neck, shoulders, hips: None, normal, Overall Severity Severity of abnormal movements (highest score from questions above): None, normal Incapacitation due to abnormal movements: None, normal Patient's awareness of abnormal movements (rate only patient's report): No Awareness, Dental Status Current problems with teeth and/or dentures?: Yes Does patient usually wear dentures?: No  CIWA:  CIWA-Ar Total: 1 COWS:  COWS Total Score: 1  Musculoskeletal: Strength & Muscle Tone: within normal limits Gait & Station: normal Patient leans: N/A  Psychiatric Specialty Exam: Review of Systems  Constitutional: Negative.   HENT: Negative.   Eyes: Negative.   Respiratory: Negative.   Cardiovascular: Negative.   Gastrointestinal: Negative.   Genitourinary: Negative.   Musculoskeletal: Negative.   Skin: Negative.   Neurological: Negative.   Endo/Heme/Allergies: Negative.   Psychiatric/Behavioral: Positive for depression (Stable) and substance abuse (Cannabis use disorder, dependence). Negative for suicidal ideas, hallucinations and memory loss. The patient has insomnia (Stable). The patient is not nervous/anxious.     Blood pressure 126/91, pulse 77, temperature 97.7 F (36.5 C), temperature source Oral, resp. rate 18, height  (1.727 m), weight 84.46 kg (186 lb 3.2 oz).Body mass index is 28.32 kg/(m^2).  See Md's SRA   Have you used any form of tobacco in the last 30 days? (Cigarettes, Smokeless Tobacco, Cigars, and/or Pipes): Yes  Has this patient used any form of tobacco in the last 30 days? (Cigarettes, Smokeless Tobacco, Cigars, and/or  Pipes): Yes, A prescription for an FDA-approved tobacco cessation medication was offered at discharge and the patient refused  Metabolic Disorder Labs:  Lab Results  Component Value Date   HGBA1C 5.6 04/26/2015   MPG 114 04/26/2015   Lab Results  Component Value Date   PROLACTIN 20.6* 04/26/2015   Lab Results  Component Value Date   CHOL 229* 04/26/2015   TRIG 253* 04/26/2015   HDL 50 04/26/2015   CHOLHDL 4.6 04/26/2015   VLDL 51* 04/26/2015   LDLCALC 128* 04/26/2015   LDLCALC 169* 04/27/2011   See Psychiatric Specialty Exam and Suicide Risk Assessment completed by Attending Physician prior to discharge.  Discharge destination:  Home  Is patient on multiple antipsychotic therapies at discharge:  Yes,   Do you recommend tapering to monotherapy for antipsychotics?  Yes   Has Patient had three or more failed trials of antipsychotic monotherapy by history:  No  Recommended Plan for Multiple Antipsychotic Therapies: And because patient has not been able to achieve symptoms control under an antipsychotic monotherapy, he is currently receiving and being discharged on 2 separate antipsychotic medications Risperdal & Seroquel which seem effective at this time. It will benefit patient to continue these combination antipsychotic therapies as recommended. However, as symptoms continue to improve, patient may be titrated down to an  antipsychotic monotherapy. This has to be done with the discretion and proper judgement of his outpatient provider.    Medication List    STOP taking these medications        ziprasidone 40 MG capsule  Commonly known as:  GEODON      TAKE these medications      Indication   benztropine 0.5 MG tablet  Commonly known as:  COGENTIN  Take 1 tablet (0.5 mg total) by mouth 2 (two) times daily. For prevention of EPS   Indication:  Extrapyramidal Reaction caused by Medications     hydrOXYzine 50 MG tablet  Commonly known as:  ATARAX/VISTARIL  Take 1 tablet  (50 mg total) by mouth 3 (three) times daily as needed for anxiety.   Indication:  Anxiety     lamoTRIgine 25 MG tablet  Commonly known as:  LAMICTAL  Take 1 tablet (25 mg total) by mouth daily. For mood stabilization   Indication:  Mood stabilization     nicotine polacrilex 2 MG gum  Commonly known as:  NICORETTE  Take 1 each (2 mg total) by mouth as needed for smoking cessation.   Indication:  Nicotine Addiction     polymixin-bacitracin 500-10000 UNIT/GM Oint ointment  Apply 1 application topically 2 (two) times daily. For wound care   Indication:  Wound care     QUEtiapine 50 MG tablet  Commonly known as:  SEROQUEL  Take 1 tablet (50 mg total) by mouth at bedtime as needed (sleep). /mood control   Indication:  Insomnia/mood control     risperiDONE 0.5 MG disintegrating tablet  Commonly known as:  RISPERDAL M-TABS  Take 1 tablet (0.5 mg total) by mouth 3 (three) times daily. For mood control/anxiety   Indication:  Mood control/anxiety       Follow-up Information    Follow up with [facility].   Why:  Therapy appt with Alyssa on Monday Feb 13th at 2pm. Medication management appt with Crystal on Thursday Feb 16th. Call office if you need to reschedule. Please bring insurance card to appts.   Contact information:   [address] [place], [place] [address] [phone]     Follow-up recommendations: Activity:  As tolerated Diet: As recommended by your primary care doctor. Keep all scheduled follow-up appointments as recommended.   Comments: Take all your medications as prescribed by your mental healthcare provider. Report any adverse effects and or reactions from your medicines to your outpatient provider promptly. Patient is instructed and cautioned to not engage in alcohol and or illegal drug use while on prescription medicines. In the event of worsening symptoms, patient is instructed to call the crisis hotline, 911 and or go to the nearest ED for  appropriate evaluation and treatment of symptoms. Follow-up with your primary care provider for your other medical issues, concerns and or health care needs.   SignedSanjuana Kava, PMHNP-BC 05/03/2015, 10:51 AM

## 2015-05-04 ENCOUNTER — Ambulatory Visit: Payer: Self-pay | Admitting: Family Medicine

## 2015-05-09 ENCOUNTER — Encounter: Payer: Self-pay | Admitting: Emergency Medicine

## 2015-05-09 ENCOUNTER — Emergency Department
Admission: EM | Admit: 2015-05-09 | Discharge: 2015-05-10 | Disposition: A | Discharge status: Other Acute Inpatient Hospital | Payer: 59 | Attending: Emergency Medicine | Admitting: Emergency Medicine

## 2015-05-09 DIAGNOSIS — Z79899 Other long term (current) drug therapy: Secondary | ICD-10-CM | POA: Diagnosis not present

## 2015-05-09 DIAGNOSIS — R4789 Other speech disturbances: Secondary | ICD-10-CM | POA: Diagnosis not present

## 2015-05-09 DIAGNOSIS — F1310 Sedative, hypnotic or anxiolytic abuse, uncomplicated: Secondary | ICD-10-CM | POA: Diagnosis not present

## 2015-05-09 DIAGNOSIS — R45851 Suicidal ideations: Secondary | ICD-10-CM

## 2015-05-09 DIAGNOSIS — L989 Disorder of the skin and subcutaneous tissue, unspecified: Secondary | ICD-10-CM | POA: Insufficient documentation

## 2015-05-09 DIAGNOSIS — F602 Antisocial personality disorder: Secondary | ICD-10-CM

## 2015-05-09 DIAGNOSIS — F29 Unspecified psychosis not due to a substance or known physiological condition: Secondary | ICD-10-CM | POA: Diagnosis not present

## 2015-05-09 DIAGNOSIS — F1320 Sedative, hypnotic or anxiolytic dependence, uncomplicated: Secondary | ICD-10-CM

## 2015-05-09 DIAGNOSIS — F1220 Cannabis dependence, uncomplicated: Secondary | ICD-10-CM | POA: Diagnosis present

## 2015-05-09 DIAGNOSIS — F17200 Nicotine dependence, unspecified, uncomplicated: Secondary | ICD-10-CM | POA: Diagnosis not present

## 2015-05-09 DIAGNOSIS — F1210 Cannabis abuse, uncomplicated: Secondary | ICD-10-CM | POA: Insufficient documentation

## 2015-05-09 DIAGNOSIS — F331 Major depressive disorder, recurrent, moderate: Secondary | ICD-10-CM | POA: Diagnosis not present

## 2015-05-09 LAB — COMPREHENSIVE METABOLIC PANEL
ALT: 40 U/L (ref 17–63)
AST: 20 U/L (ref 15–41)
Albumin: 4.4 g/dL (ref 3.5–5.0)
Alkaline Phosphatase: 63 U/L (ref 38–126)
Anion gap: 6 (ref 5–15)
BUN: 11 mg/dL (ref 6–20)
CHLORIDE: 106 mmol/L (ref 101–111)
CO2: 28 mmol/L (ref 22–32)
Calcium: 9.8 mg/dL (ref 8.9–10.3)
Creatinine, Ser: 0.74 mg/dL (ref 0.61–1.24)
Glucose, Bld: 118 mg/dL — ABNORMAL HIGH (ref 65–99)
POTASSIUM: 3.9 mmol/L (ref 3.5–5.1)
Sodium: 140 mmol/L (ref 135–145)
Total Bilirubin: 0.4 mg/dL (ref 0.3–1.2)
Total Protein: 7.7 g/dL (ref 6.5–8.1)

## 2015-05-09 LAB — CBC
HCT: 43.9 % (ref 40.0–52.0)
Hemoglobin: 14.7 g/dL (ref 13.0–18.0)
MCH: 29 pg (ref 26.0–34.0)
MCHC: 33.6 g/dL (ref 32.0–36.0)
MCV: 86.3 fL (ref 80.0–100.0)
PLATELETS: 374 10*3/uL (ref 150–440)
RBC: 5.08 MIL/uL (ref 4.40–5.90)
RDW: 14.3 % (ref 11.5–14.5)
WBC: 11 10*3/uL — AB (ref 3.8–10.6)

## 2015-05-09 LAB — URINE DRUG SCREEN, QUALITATIVE (ARMC ONLY)
AMPHETAMINES, UR SCREEN: NOT DETECTED
Barbiturates, Ur Screen: NOT DETECTED
Benzodiazepine, Ur Scrn: POSITIVE — AB
CANNABINOID 50 NG, UR ~~LOC~~: POSITIVE — AB
Cocaine Metabolite,Ur ~~LOC~~: NOT DETECTED
MDMA (ECSTASY) UR SCREEN: NOT DETECTED
Methadone Scn, Ur: NOT DETECTED
Opiate, Ur Screen: NOT DETECTED
Phencyclidine (PCP) Ur S: NOT DETECTED
TRICYCLIC, UR SCREEN: NOT DETECTED

## 2015-05-09 LAB — ACETAMINOPHEN LEVEL: Acetaminophen (Tylenol), Serum: <10 ug/mL — ABNORMAL LOW (ref 10–30)

## 2015-05-09 LAB — ETHANOL

## 2015-05-09 LAB — SALICYLATE LEVEL

## 2015-05-09 MED ORDER — HYDROXYZINE HCL 25 MG PO TABS
50.0000 mg | ORAL_TABLET | Freq: Every day | ORAL | Status: DC
  Administered 2015-05-09: 50 mg via ORAL
  Filled 2015-05-09: qty 2

## 2015-05-09 MED ORDER — RISPERIDONE 0.5 MG PO TABS
0.5000 mg | ORAL_TABLET | Freq: Three times a day (TID) | ORAL | Status: DC
  Administered 2015-05-09 – 2015-05-10 (×3): 0.5 mg via ORAL
  Filled 2015-05-09 (×3): qty 1

## 2015-05-09 MED ORDER — LORAZEPAM 2 MG/ML IJ SOLN
INTRAMUSCULAR | Status: AC
  Administered 2015-05-09: 2 mg via INTRAVENOUS
  Filled 2015-05-09: qty 1

## 2015-05-09 MED ORDER — LORAZEPAM 2 MG/ML IJ SOLN
2.0000 mg | Freq: Once | INTRAMUSCULAR | Status: AC
  Administered 2015-05-09: 2 mg via INTRAVENOUS

## 2015-05-09 MED ORDER — NICOTINE 21 MG/24HR TD PT24
21.0000 mg | MEDICATED_PATCH | Freq: Every day | TRANSDERMAL | Status: DC
  Administered 2015-05-09: 21 mg via TRANSDERMAL
  Filled 2015-05-09 (×2): qty 1

## 2015-05-09 MED ORDER — ESCITALOPRAM OXALATE 10 MG PO TABS
20.0000 mg | ORAL_TABLET | Freq: Every day | ORAL | Status: DC
  Administered 2015-05-09 – 2015-05-10 (×2): 20 mg via ORAL
  Filled 2015-05-09 (×2): qty 2

## 2015-05-09 NOTE — Progress Notes (Signed)
TTS has consulted with Dr. Ardyth Harps who as assessed the pt. It has been recommended that TTS seek inpatient placement.  Pt will be referred to:    [facility]  [facility]   [facility]   TTS will also begin the process to refer pt to [facility].   05/09/2015 Cheryl Flash, MS, NCC, LPCA Therapeutic Triage Specialist

## 2015-05-09 NOTE — ED Notes (Signed)
IVC/Consult completed/ Pending placement 

## 2015-05-09 NOTE — ED Notes (Signed)
Pt resting in bed, eating, pt tearful

## 2015-05-09 NOTE — ED Notes (Signed)
Lanier Ensign, CNA sitting with pt 1:1.

## 2015-05-09 NOTE — ED Notes (Signed)
Pt speaking with psych MD

## 2015-05-09 NOTE — ED Notes (Signed)
Pt presents to ED with sister for c/o of hopelessness and plan to end his life.

## 2015-05-09 NOTE — ED Notes (Signed)
1:1 sitter at bedside.
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
## 2015-05-09 NOTE — Consult Note (Signed)
[place] Psychiatry Consult   Reason for Consult:  Suicidality Referring Physician:  ER Patient Identification: Jaime Shannon MRN:  [id number] Principal Diagnosis: Major depressive disorder [facility]) Diagnosis:   Patient Active Problem List   Diagnosis Date Noted  . Antisocial personality disorder [F60.2] 05/09/2015  . Tobacco use disorder [F17.200] 05/09/2015  . 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 Major depressive disorder ([place]) [F32.9] 05/09/2015  . Cannabis use disorder, severe, dependence ([place]) [F12.20] 04/26/2015    Total Time spent with patient: 1 hour  Subjective:   Jaime Shannon is a 35 y.o. male patient admitted with  suicidal ideation  HPI:   The patient is 35 year old married Caucasian male from [place] who presented to our emergency Department on 2/6 voicing suicidal ideation.  The patient denies having any specific plan to hurt himself but is states he's been thinking about razor blades a lot lately.  Patient tells me he has been diagnosed with bipolar disorder in the past. He was recently discharge from [facility]. Per review of records he was there from January 23 to January 31. He was admitted due to suicidality. He was diagnosed with bipolar disorder and was discharged on Risperdal 0.5 mg 3 times a day, quetiapine 50 mg at bedtime, lamotrigine 25 mg a day, Vistaril 50 mg 3 times a day as needed and benztropine 0.5 mg by mouth twice a day. He was a scheduled to follow up with neuropsychiatric center in [place] and his appointment is on February 13.  After discharge patient was unable to comply with all the medications he was prescribed because they were too expensive even with insurance. The patient says he's only been taking Vistaril 50 mg however he doesn't take it 3 times a day his being taken it up to 6 times daily. He also has been taking Lamictal 25 mg a day. He has not been able to fill his prescription for  quetiapine and risperidone or benztropine.  Patient explains that his current stressors are mainly related to his relationship with his wife.  He got married about 7 months ago. He states that even though his wife knew about his history of bipolar she is now wanting to divorce him due to his mental health issues.  Patient states that this morning he had to go to court because his wife filed for a 50 B (restraining order).  He denies ever being physically aggressive or verbally aggressive towards her.  Patient stated that his wife kicked him out of the house in January. Patient has been living in his car since January 31.  Patient feels that there is no reason for living anymore. He feels that the only solution for his problems is to kill himself and eradicate the illness that he suffers from (bipolar).  He reports depressed mood. He says that at work he is not able to perform anymore. 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 He says that he is being missing tikets and has been making errors in meal preparation. He reports feeling hopeless and helpless.   Patient says that early in January he burned himself with a butter knive that he placed on the stove. The reasons why he did that were difficult to follow but apparently he wanted to remove his tattoos as his wife didn't approve of them.  Patient denies auditory or visual hallucinations, he denies homicidal ideation. He does complain of depressed mood, insomnia,. He reports having  manic episodes in the past however when I asked him to describe this episode the patient states he does not remember much of what happened and they can last from minutes to weeks.  Substance abuse history patient reports smoking marijuana on a daily basis. He says he smokes about 1-1/2-4 g of marijuana a day. He feels that the marijuana helps inquire his mind down. Patient also purchased On the street and he uses about a couple times a month. He reports using  Xanax today prior to his arrival to the emergency room he denies the current use of any other substances. In the past however he has prevented with ecstasy,heroine and cocaine.  Denies IV drug use.  As far as nicotine use patient said he has been is smoking 3-4 packs of cigarettes per day  Past Psychiatric History: Patient reportedly diagnosed with bipolar disorder. He has been hospitalized in our facility at least 5 times before. Per review of records patient was here twice in 2013 and then again seen in our emergency department in January from where he was referred to [facility] behavioral health for hospitalization.  Patient denies history of prior suicidal attempts. As far as self injury patient denied history of cutting however per records K he did have a history of self injuring by cutting while in jail. Patient also reports punching his cell phone to face and knocking himself unconscious.  Risk to Self: Is patient at risk for suicide?: Yes Risk to Others:  no  Past Medical History: Patient reports prior episodes of kidney stones and having a broken jaw. He denies any history of seizures. As far as head trauma reports multiple head trauma events with loss of consciousness Past Medical History  Diagnosis Date  . Kidney stone   . Bipolar 1 disorder [facility])     Past Surgical History  Procedure Laterality Date  . Abdominal surgery    . Surgery scrotal / testicular     Family History: Patient reports that his whole paternal side of the family suffer from mental illness Family History  Problem Relation Age of Onset  . Bipolar disorder Father   . Alcoholism Father   . Bipolar disorder Other   . Heart Problems Maternal Grandfather     Social History: Patient is married he does not have any children with his wife but has a 33-year-old daughter from a prior relationship that he sees on the weekends. Patient is currently homeless and has been living in his car since January 31. He is employed  and works as a Training and development officer at [employer]. Patient stated that he has worked as a Training and development officer for about 17 years.  As far as his legal history patient reports having 57 felonies but is states that none of his charges were bile and. He was last incarcerated in 2001 History  Alcohol Use  . Yes     History  Drug Use  . Yes  . Special: Marijuana    Comment: every day for two weeks    Social History   Social History  . Marital Status: Married    Spouse Name: N/A  . Number of Children: N/A  . Years of Education: N/A   Social History Main Topics  . Smoking status: Current Every Day Smoker  . Smokeless tobacco: None  . Alcohol Use: Yes  . Drug Use: Yes    Special: Marijuana     Comment: every day for two weeks  . Sexual Activity: Not Asked  Other Topics Concern  . None   Social History Narrative   Additional Social History:    Allergies:   Allergies  Allergen Reactions  . Ambien [Zolpidem Tartrate]     hyperactive  . Trazodone And Nefazodone Other (See Comments)    tremor  . Promethazine Nausea Only    Labs:  Results for orders placed or performed during the hospital encounter of 05/09/15 (from the past 48 hour(s))  Comprehensive metabolic panel     Status: Abnormal   Collection Time: 05/09/15  1:44 PM  Result Value Ref Range   Sodium 140 135 - 145 mmol/L   Potassium 3.9 3.5 - 5.1 mmol/L   Chloride 106 101 - 111 mmol/L   CO2 28 22 - 32 mmol/L   Glucose, Bld 118 (H) 65 - 99 mg/dL   BUN 11 6 - 20 mg/dL   Creatinine, Ser 0.74 0.61 - 1.24 mg/dL   Calcium 9.8 8.9 - 10.3 mg/dL   Total Protein 7.7 6.5 - 8.1 g/dL   Albumin 4.4 3.5 - 5.0 g/dL   AST 20 15 - 41 U/L   ALT 40 17 - 63 U/L   Alkaline Phosphatase 63 38 - 126 U/L   Total Bilirubin 0.4 0.3 - 1.2 mg/dL   GFR calc non Af Amer >60 >60 mL/min   GFR calc Af Amer >60 >60 mL/min    Comment: (NOTE) The eGFR has been calculated using the CKD EPI equation. This calculation has not been validated in all clinical  situations. eGFR's persistently <60 mL/min signify possible Chronic Kidney Disease.    Anion gap 6 5 - 15  Ethanol (ETOH)     Status: None   Collection Time: 05/09/15  1:44 PM  Result Value Ref Range   Alcohol, Ethyl (B) <5 <5 mg/dL    Comment:        LOWEST DETECTABLE LIMIT FOR SERUM ALCOHOL IS 5 mg/dL FOR MEDICAL PURPOSES ONLY   Salicylate level     Status: None   Collection Time: 05/09/15  1:44 PM  Result Value Ref Range   Salicylate Lvl <2.0 2.8 - 30.0 mg/dL  Acetaminophen level     Status: Abnormal   Collection Time: 05/09/15  1:44 PM  Result Value Ref Range   Acetaminophen (Tylenol), Serum <10 (L) 10 - 30 ug/mL    Comment:        THERAPEUTIC CONCENTRATIONS VARY SIGNIFICANTLY. A RANGE OF 10-30 ug/mL MAY BE AN EFFECTIVE CONCENTRATION FOR MANY PATIENTS. HOWEVER, SOME ARE BEST TREATED AT CONCENTRATIONS OUTSIDE THIS RANGE. ACETAMINOPHEN CONCENTRATIONS >150 ug/mL AT 4 HOURS AFTER INGESTION AND >50 ug/mL AT 12 HOURS AFTER INGESTION ARE OFTEN ASSOCIATED WITH TOXIC REACTIONS.   CBC     Status: Abnormal   Collection Time: 05/09/15  1:44 PM  Result Value Ref Range   WBC 11.0 (H) 3.8 - 10.6 K/uL   RBC 5.08 4.40 - 5.90 MIL/uL   Hemoglobin 14.7 13.0 - 18.0 g/dL   HCT 43.9 40.0 - 52.0 %   MCV 86.3 80.0 - 100.0 fL   MCH 29.0 26.0 - 34.0 pg   MCHC 33.6 32.0 - 36.0 g/dL   RDW 14.3 11.5 - 14.5 %   Platelets 374 150 - 440 K/uL  Urine Drug Screen, Qualitative ([place] only)     Status: Abnormal   Collection Time: 05/09/15  1:48 PM  Result Value Ref Range   Tricyclic, Ur Screen NONE DETECTED NONE DETECTED   Amphetamines, Ur Screen NONE DETECTED NONE DETECTED  MDMA (Ecstasy)Ur Screen NONE DETECTED NONE DETECTED   Cocaine Metabolite,Ur [place] NONE DETECTED NONE DETECTED   Opiate, Ur Screen NONE DETECTED NONE DETECTED   Phencyclidine (PCP) Ur S NONE DETECTED NONE DETECTED   Cannabinoid 50 Ng, Ur [place] POSITIVE (A) NONE DETECTED   Barbiturates, Ur Screen NONE DETECTED NONE DETECTED    Benzodiazepine, Ur Scrn POSITIVE (A) NONE DETECTED   Methadone Scn, Ur NONE DETECTED NONE DETECTED    Comment: (NOTE) 481  Tricyclics, urine               Cutoff 1000 ng/mL 200  Amphetamines, urine             Cutoff 1000 ng/mL 300  MDMA (Ecstasy), urine           Cutoff 500 ng/mL 400  Cocaine Metabolite, urine       Cutoff 300 ng/mL 500  Opiate, urine                   Cutoff 300 ng/mL 600  Phencyclidine (PCP), urine      Cutoff 25 ng/mL 700  Cannabinoid, urine              Cutoff 50 ng/mL 800  Barbiturates, urine             Cutoff 200 ng/mL 900  Benzodiazepine, urine           Cutoff 200 ng/mL 1000 Methadone, urine                Cutoff 300 ng/mL 1100 1200 The urine drug screen provides only a preliminary, unconfirmed 1300 analytical test result and should not be used for non-medical 1400 purposes. Clinical consideration and professional judgment should 1500 be applied to any positive drug screen result due to possible 1600 interfering substances. A more specific alternate chemical method 1700 must be used in order to obtain a confirmed analytical result.  1800 Gas chromato graphy / mass spectrometry (GC/MS) is the preferred 1900 confirmatory method.     Current Facility-Administered Medications  Medication Dose Route Frequency Provider Last Rate Last Dose  . escitalopram (LEXAPRO) tablet 20 mg  20 mg Oral Daily Hildred Priest, MD      . hydrOXYzine (ATARAX/VISTARIL) tablet 50 mg  50 mg Oral QHS Hildred Priest, MD      . risperiDONE (RISPERDAL) tablet 0.5 mg  0.5 mg Oral TID Hildred Priest, MD       Current Outpatient Prescriptions  Medication Sig Dispense Refill  . benztropine (COGENTIN) 0.5 MG tablet Take 1 tablet (0.5 mg total) by mouth 2 (two) times daily. For prevention of EPS 60 tablet 0  . hydrOXYzine (ATARAX/VISTARIL) 50 MG tablet Take 1 tablet (50 mg total) by mouth 3 (three) times daily as needed for anxiety. 60 tablet 0  . lamoTRIgine  (LAMICTAL) 25 MG tablet Take 1 tablet (25 mg total) by mouth daily. For mood stabilization 30 tablet 0  . nicotine polacrilex (NICORETTE) 2 MG gum Take 1 each (2 mg total) by mouth as needed for smoking cessation. 100 tablet 0  . polymixin-bacitracin (POLYSPORIN) 500-10000 UNIT/GM OINT ointment Apply 1 application topically 2 (two) times daily. For wound care 1 Tube 0  . QUEtiapine (SEROQUEL) 50 MG tablet Take 1 tablet (50 mg total) by mouth at bedtime as needed (sleep). /mood control 30 tablet 0  . risperiDONE (RISPERDAL M-TABS) 0.5 MG disintegrating tablet Take 1 tablet (0.5 mg total) by mouth 3 (three) times daily. For mood control/anxiety 90 tablet  0    Musculoskeletal: Strength & Muscle Tone: within normal limits Gait & Station: normal Patient leans: N/A  Psychiatric Specialty Exam: Review of Systems  Constitutional: Negative.   HENT: Negative.   Eyes: Negative.   Respiratory: Negative.   Cardiovascular: Negative.   Gastrointestinal: Negative.   Genitourinary: Negative.   Musculoskeletal: Negative.   Skin: Negative.        Scab on left forearm from recent self inflicted burn  Neurological: Negative.   Endo/Heme/Allergies: Negative.   Psychiatric/Behavioral: Positive for depression, suicidal ideas and substance abuse. Negative for hallucinations and memory loss. The patient has insomnia. The patient is not nervous/anxious.     Blood pressure 145/98, pulse 113, temperature 98.7 F (37.1 C), temperature source Oral, resp. rate 18, height _0  (1.778 m), weight 87.544 kg (193 lb), SpO2 98 %.Body mass index is 27.69 kg/(m^2).  General Appearance: Fairly Groomed  Engineer, water::  Fair  Speech:  Clear and Coherent  Volume:  Normal  Mood:  Irritable  Affect:  Congruent  Thought Process:  Logical  Orientation:  Full (Time, Place, and Person)  Thought Content:  Hallucinations: None  Suicidal Thoughts:  Yes.  with intent/plan  Homicidal Thoughts:  No  Memory:  Immediate;    Good Recent;   Good Remote;   Good  Judgement:  Poor  Insight:  Shallow  Psychomotor Activity:  Normal  Concentration:  Good  Recall:  Good  Fund of Knowledge:Good  Language: Good  Akathisia:  No  Handed:    AIMS (if indicated):     Assets:  Communication Skills Physical Health Talents/Skills  ADL's:  Intact  Cognition: WNL  Sleep:      Treatment Plan Summary: Daily contact with patient to assess and evaluate symptoms and progress in treatment and Medication management   35 year old Caucasian male with prior history of mood instability and antisocial traits presents to our emergency department voicing suicidal ideation.  Current stressors include marital problems, legal charges, homelessness and financial stressors.  Major depressive disorder: Patient will be started on an antidepressant to target depressive symptoms. Lexapro 20 mg q day  Anxiety-irritability: I will start the patient on Risperdal 0.5 mg by mouth 3 times a day.  Insomnia: Will order Vistaril 50 mg by mouth daily at bedtime  Tobacco use disorder: Patient will need a nicotine patch 21 mg.  At this time our unit does not have any beds available. Will refer this patient out to other psychiatric facilities for stabilization.  Disposition: Recommend psychiatric Inpatient admission when medically cleared.  Hildred Priest, MD 05/09/2015 4:24 PM

## 2015-05-09 NOTE — ED Provider Notes (Signed)
[facility] Emergency Department Provider Note  ____________________________________________  Time seen: Approximately 240 PM  I have reviewed the triage vital signs and the nursing notes.   HISTORY  Chief Complaint Suicidal    HPI Jaime Shannon is a 35 y.o. male with a history of bipolar disorder who is presenting today with suicidal ideation. He says that he has had increased suicidal ideation ever since his wife decided to take the court for his "mental disorder." He is not able to give further details about his Court case. He says that he has a plan to slit his wrists or jump off a bridge or jump in traffic. He says anything to kill himself. He says that he also burned himself with a butter knife to his left upper extremity several weeks ago in order to remove a tattoo.   Past Medical History  Diagnosis Date  . Kidney stone   . Bipolar 1 disorder [facility])     Patient Active Problem List   Diagnosis Date Noted  . Panic disorder 04/27/2015  . Bipolar disorder, curr episode mixed, severe, with psychotic features (H[facility]) 04/26/2015  . Cannabis use disorder, severe, dependence ([facility]) 04/26/2015    Past Surgical History  Procedure Laterality Date  . Abdominal surgery    . Surgery scrotal / testicular      Current Outpatient Rx  Name  Route  Sig  Dispense  Refill  . benztropine (COGENTIN) 0.5 MG tablet   Oral   Take 1 tablet (0.5 mg total) by mouth 2 (two) times daily. For prevention of EPS   60 tablet   0   . hydrOXYzine (ATARAX/VISTARIL) 50 MG tablet   Oral   Take 1 tablet (50 mg total) by mouth 3 (three) times daily as needed for anxiety.   60 tablet   0   . lamoTRIgine (LAMICTAL) 25 MG tablet   Oral   Take 1 tablet (25 mg total) by mouth daily. For mood stabilization   30 tablet   0   . nicotine polacrilex (NICORETTE) 2 MG gum   Oral   Take 1 each (2 mg total) by mouth as needed for smoking cessation.   100 tablet   0   .  polymixin-bacitracin (POLYSPORIN) 500-10000 UNIT/GM OINT ointment   Topical   Apply 1 application topically 2 (two) times daily. For wound care   1 Tube   0   . QUEtiapine (SEROQUEL) 50 MG tablet   Oral   Take 1 tablet (50 mg total) by mouth at bedtime as needed (sleep). /mood control   30 tablet   0   . risperiDONE (RISPERDAL M-TABS) 0.5 MG disintegrating tablet   Oral   Take 1 tablet (0.5 mg total) by mouth 3 (three) times daily. For mood control/anxiety   90 tablet   0     Allergies Ambien; Trazodone and nefazodone; and Promethazine  Family History  Problem Relation Age of Onset  . Bipolar disorder Father   . Alcoholism Father   . Bipolar disorder Other   . Heart Problems Maternal Grandfather     Social History Social History  Substance Use Topics  . Smoking status: Current Every Day Smoker  . Smokeless tobacco: None  . Alcohol Use: Yes    Review of Systems Constitutional: No fever/chills Eyes: No visual changes. ENT: No sore throat. Cardiovascular: Denies chest pain. Respiratory: Denies shortness of breath. Gastrointestinal: No abdominal pain.  No nausea, no vomiting.  No diarrhea.  No constipation.  Genitourinary: Negative for dysuria. Musculoskeletal: Negative for back pain. Skin: Negative for rash. Neurological: Negative for headaches, focal weakness or numbness.  10-point ROS otherwise negative.  ____________________________________________   PHYSICAL EXAM:  VITAL SIGNS: ED Triage Vitals  Enc Vitals Group     BP 05/09/15 1343 145/98 mmHg     Pulse Rate 05/09/15 1343 113     Resp 05/09/15 1343 18     Temp 05/09/15 1343 98.7 F (37.1 C)     Temp Source 05/09/15 1343 Oral     SpO2 05/09/15 1343 98 %     Weight 05/09/15 1343 193 lb (87.544 kg)     Height 05/09/15 1343  (1.778 m)     Head Cir --      Pe[place] --      Pain Score --      Pain Loc --      Pain Edu? --      Excl. in GC? --     Constitutional: Alert and oriented. Well  appearing and in no acute distress. Eyes: Conjunctivae are normal. PERRL. EOMI. Head: Atraumatic. Nose: No congestion/rhinnorhea. Mouth/Throat: Mucous membranes are moist.  Oropharynx non-erythematous. Neck: No stridor.   Cardiovascular: Normal rate, regular rhythm. Grossly normal heart sounds.  Good peripheral circulation. Respiratory: Normal respiratory effort.  No retractions. Lungs CTAB. Gastrointestinal: Soft and nontender. No distention. No abdominal bruits. No CVA tenderness. Musculoskeletal: No lower extremity tenderness nor edema.  No joint effusions. Neurologic:  Normal speech and language. No gross focal neurologic deficits are appreciated. No gait instability. Skin:  Skin is warm, dry and intact.  1 x 4 cm scabbed over lesion to the left forearm without any bulla. No surrounding induration or pus. Mild tenderness to palpation. This is the area where the patient says he burned himself several weeks ago. Psychiatric: Odd affect with pressured speech.  ____________________________________________   LABS (all labs ordered are listed, but only abnormal results are displayed)  Labs Reviewed  COMPREHENSIVE METABOLIC PANEL - Abnormal; Notable for the following:    Glucose, Bld 118 (*)    All other components within normal limits  ACETAMINOPHEN LEVEL - Abnormal; Notable for the following:    Acetaminophen (Tylenol), Serum <10 (*)    All other components within normal limits  CBC - Abnormal; Notable for the following:    WBC 11.0 (*)    All other components within normal limits  URINE DRUG SCREEN, QUALITATIVE ([facility] ONLY) - Abnormal; Notable for the following:    Cannabinoid 50 Ng, Ur [place] POSITIVE (*)    Benzodiazepine, Ur Scrn POSITIVE (*)    All other components within normal limits  ETHANOL  SALICYLATE LEVEL    ____________________________________________  EKG   ____________________________________________  RADIOLOGY   ____________________________________________   PROCEDURES  ____________________________________________   INITIAL IMPRESSION / ASSESSMENT AND PLAN / ED COURSE  Pertinent labs & imaging results that were available during my care of the patient were reviewed by me and considered in my medical decision making (see chart for details).  Patient with odd affect and pressured speech also with suicidal ideation. I'm concerned for psychotic features in this patient. We will involuntarily committed and consult psychiatry for further recommendations. Burn without any signs of infection. ____________________________________________   FINAL CLINICAL IMPRESSION(S) / ED DIAGNOSES  Suicidal ideation. Psychosis.    Myrna Blazer, MD 05/09/15 [phone]

## 2015-05-09 NOTE — BHH Counselor (Signed)
Per RN at [facility], pt has been declined.  Per RN, HH does not take pts with Antisocial Personality Disorder.  Beryle Flock, MS, CRC, [facility] Triage Specialist [facility]

## 2015-05-09 NOTE — ED Notes (Signed)
Pt tearful, rocking on bed, states "when you take a vow you are supposed to honor it", MD notified, pt given Im ativan, pt states "nothing will hurt as much as my heart", pt encouraged to lie down and take deep breathes

## 2015-05-09 NOTE — ED Notes (Signed)
Pt with Dr. Clapacs. 

## 2015-05-09 NOTE — ED Notes (Signed)
Psych MD at bedside

## 2015-05-10 ENCOUNTER — Inpatient Hospital Stay
Admission: EM | Admit: 2015-05-10 | Discharge: 2015-05-11 | DRG: 885 | Disposition: A | Discharge status: Home / Self Care | Payer: No Typology Code available for payment source | Source: Intra-hospital | Attending: Psychiatry | Admitting: Psychiatry

## 2015-05-10 DIAGNOSIS — F602 Antisocial personality disorder: Secondary | ICD-10-CM | POA: Diagnosis present

## 2015-05-10 DIAGNOSIS — Z9114 Patient's other noncompliance with medication regimen: Secondary | ICD-10-CM | POA: Diagnosis not present

## 2015-05-10 DIAGNOSIS — R45851 Suicidal ideations: Secondary | ICD-10-CM | POA: Diagnosis present

## 2015-05-10 DIAGNOSIS — Z79899 Other long term (current) drug therapy: Secondary | ICD-10-CM

## 2015-05-10 DIAGNOSIS — F332 Major depressive disorder, recurrent severe without psychotic features: Secondary | ICD-10-CM | POA: Diagnosis present

## 2015-05-10 DIAGNOSIS — Z888 Allergy status to other drugs, medicaments and biological substances status: Secondary | ICD-10-CM

## 2015-05-10 DIAGNOSIS — G4700 Insomnia, unspecified: Secondary | ICD-10-CM | POA: Diagnosis present

## 2015-05-10 DIAGNOSIS — F3160 Bipolar disorder, current episode mixed, unspecified: Secondary | ICD-10-CM | POA: Diagnosis not present

## 2015-05-10 DIAGNOSIS — E8881 Metabolic syndrome: Secondary | ICD-10-CM | POA: Diagnosis present

## 2015-05-10 DIAGNOSIS — Z9889 Other specified postprocedural states: Secondary | ICD-10-CM | POA: Diagnosis not present

## 2015-05-10 DIAGNOSIS — Z9119 Patient's noncompliance with other medical treatment and regimen: Secondary | ICD-10-CM

## 2015-05-10 DIAGNOSIS — F1290 Cannabis use, unspecified, uncomplicated: Secondary | ICD-10-CM | POA: Diagnosis present

## 2015-05-10 DIAGNOSIS — Z87442 Personal history of urinary calculi: Secondary | ICD-10-CM

## 2015-05-10 DIAGNOSIS — Z590 Homelessness: Secondary | ICD-10-CM | POA: Diagnosis not present

## 2015-05-10 DIAGNOSIS — Z811 Family history of alcohol abuse and dependence: Secondary | ICD-10-CM | POA: Diagnosis not present

## 2015-05-10 DIAGNOSIS — F17200 Nicotine dependence, unspecified, uncomplicated: Secondary | ICD-10-CM | POA: Diagnosis present

## 2015-05-10 DIAGNOSIS — Z813 Family history of other psychoactive substance abuse and dependence: Secondary | ICD-10-CM | POA: Diagnosis not present

## 2015-05-10 DIAGNOSIS — F1220 Cannabis dependence, uncomplicated: Secondary | ICD-10-CM | POA: Diagnosis present

## 2015-05-10 DIAGNOSIS — Z818 Family history of other mental and behavioral disorders: Secondary | ICD-10-CM | POA: Diagnosis not present

## 2015-05-10 DIAGNOSIS — F1320 Sedative, hypnotic or anxiolytic dependence, uncomplicated: Secondary | ICD-10-CM | POA: Diagnosis present

## 2015-05-10 DIAGNOSIS — F411 Generalized anxiety disorder: Secondary | ICD-10-CM | POA: Diagnosis present

## 2015-05-10 DIAGNOSIS — F29 Unspecified psychosis not due to a substance or known physiological condition: Secondary | ICD-10-CM | POA: Diagnosis not present

## 2015-05-10 MED ORDER — CHLORDIAZEPOXIDE HCL 25 MG PO CAPS
25.0000 mg | ORAL_CAPSULE | Freq: Four times a day (QID) | ORAL | Status: DC
  Administered 2015-05-10 – 2015-05-11 (×3): 25 mg via ORAL
  Filled 2015-05-10 (×3): qty 1

## 2015-05-10 MED ORDER — ACETAMINOPHEN 325 MG PO TABS: 650.0000 mg | ORAL_TABLET | Freq: Four times a day (QID) | ORAL | Status: DC | PRN

## 2015-05-10 MED ORDER — VENLAFAXINE HCL ER 75 MG PO CP24: 75.0000 mg | ORAL_CAPSULE | Freq: Every day | ORAL | Status: DC

## 2015-05-10 MED ORDER — MAGNESIUM HYDROXIDE 400 MG/5ML PO SUSP: 30.0000 mL | Freq: Every day | ORAL | Status: DC | PRN

## 2015-05-10 MED ORDER — ALUM & MAG HYDROXIDE-SIMETH 200-200-20 MG/5ML PO SUSP: 30.0000 mL | ORAL | Status: DC | PRN

## 2015-05-10 MED ORDER — HYDROXYZINE HCL 25 MG PO TABS: 25.0000 mg | ORAL_TABLET | Freq: Three times a day (TID) | ORAL | Status: DC | PRN

## 2015-05-10 MED ORDER — DIPHENHYDRAMINE HCL 25 MG PO CAPS
50.0000 mg | ORAL_CAPSULE | Freq: Every day | ORAL | Status: DC
  Administered 2015-05-10: 50 mg via ORAL
  Filled 2015-05-10: qty 2

## 2015-05-10 MED ORDER — RISPERIDONE 1 MG PO TABS
2.0000 mg | ORAL_TABLET | Freq: Two times a day (BID) | ORAL | Status: DC
  Administered 2015-05-10 – 2015-05-11 (×2): 2 mg via ORAL
  Filled 2015-05-10 (×2): qty 2

## 2015-05-10 MED ORDER — NICOTINE 21 MG/24HR TD PT24
21.0000 mg | MEDICATED_PATCH | Freq: Every day | TRANSDERMAL | Status: DC
  Filled 2015-05-10: qty 1

## 2015-05-10 NOTE — ED Notes (Signed)
Patient observed lying in bed with eyes closed  Even, unlabored respirations observed   NAD pt appears to be sleeping  I will continue to monitor along with every 15 minute visual observations and ongoing security monitoring    

## 2015-05-10 NOTE — BH Assessment (Signed)
Patient is to be admitted to [facility] by Dr. Ardyth Harps.  Attending Physician will be Dr. Jennet Maduro.   Patient has been assigned to room 311-A, by [facility] Charge Nurse [place].   Intake Paper Work has been signed and placed on patient chart.  ER staff is aware of the admission Misty Stanley, ER Sect.; Dr. Lenard Lance, ER MD; Murlean Iba., Patient's Nurse & Dow Adolph, Patient Access).

## 2015-05-10 NOTE — ED Notes (Signed)
Report called to [facility] RN  LL [facility]  Pt to transfer once they are in the computer

## 2015-05-10 NOTE — ED Notes (Signed)
Am meds administered as ordered  Assessment completed  He denies pain  Continue to monitor   

## 2015-05-10 NOTE — ED Notes (Signed)
Breakfast provided  Pt observed with no unusual behavior  Appropriate to stimulation  No verbalized needs or concerns at this time  NAD assessed  Continue to monitor 

## 2015-05-10 NOTE — Progress Notes (Signed)
Admission Note:  D: Pt appeared depressed  With  a flat affect.  Pt  denies SI / AVH at this time. Patient had plan to cut himself . Unable to follow through with his medication  Too expensive. Up set he cant keep a wife  Due to his diagnosis.   Pt has allergy to Ambien, Trazodone, and Phenergan. Wife of 7 months  Took out a 50 B on him.  Positive for Benzodiazepines and Pot. Has a burn with large scab on it left arm  A: Pt admitted to unit per protocol, skin assessment and search done and no contraband found.  Pt  educated on therapeutic milieu rules. Pt was introduced to milieu by nursing staff.    R: Pt was receptive to education about the milieu .  15 min safety checks started. Clinical research associate offered support

## 2015-05-10 NOTE — ED Notes (Signed)

## 2015-05-10 NOTE — Progress Notes (Signed)
LCSW met with patient as he asked to speak to someone. LCSW introduced myself and patient reported that he is feeling better. He explained he is Bipolar and and has good insight to know when he is Manic and the symptoms. He reported when he left the hospital he went to get script filled and he could not afford the resperidal. He  Reports that he feels this is the only medicine that really works.  LCSW collected other important data and supported patient when he reviewed current break up of wife, and recent stressors.  He reports this has been a very hard struggle for sometime and he has done everything right to change and it still doesnt matter where his ex is concerned.  [employer] LCSW [phone]

## 2015-05-10 NOTE — ED Provider Notes (Signed)
-----------------------------------------   6:59 AM on 05/10/2015 -----------------------------------------   Blood pressure 115/75, pulse 77, temperature 98.7 F (37.1 C), temperature source Oral, resp. rate 18, height  (1.778 m), weight 193 lb (87.544 kg), SpO2 96 %.  The patient had no acute events since last update.  Calm and cooperative at this time.    Plan for inpatient admission. The patient [facility]. The patient has been denied at [facility].     Rebecka Apley, MD 05/10/15 0700

## 2015-05-10 NOTE — ED Provider Notes (Signed)
-----------------------------------------   1:21 PM on 05/10/2015 -----------------------------------------  Patient has been seen and evaluated by psychiatry, they will be admitted to the service for further inpatient treatment.  Minna Antis, MD 05/10/15 1321

## 2015-05-10 NOTE — Plan of Care (Signed)
Problem: Ineffective individual coping Goal: STG: Patient will remain free from self harm Outcome: Progressing Denies suicidal ideations at present

## 2015-05-10 NOTE — ED Notes (Signed)
BEHAVIORAL HEALTH ROUNDING Patient sleeping: No. Patient alert and oriented: yes Behavior appropriate: Yes.  ; If no, describe:  Nutrition and fluids offered: yes Toileting and hygiene offered: Yes  Sitter present: q15 minute observations and security  monitoring Law enforcement present: Yes  ODS  

## 2015-05-10 NOTE — B[facility] Group Notes (Signed)
[facility] Group Notes:  (Nursing/MHT/Case Management/Adjunct)  Date:  05/10/2015  Time:  10:26 PM  Type of Therapy:  Evening Wrap-up Group  Participation Level:Good  Participation Quality: Good  Affect: Appropriate  Cognitive: Alert  Insight: Good  Engagement in Group: Good  Modes of Intervention: Discussion  Summary of Progress/Problems:  Jaime Shannon 05/10/2015, 10:26 PM

## 2015-05-10 NOTE — Tx Team (Signed)
Initial Interdisciplinary Treatment Plan   PATIENT STRESSORS: Financial difficulties Marital or family conflict Medication change or noncompliance Substance abuse   PATIENT STRENGTHS: Ability for insight Average or above average intelligence Capable of independent living Communication skills General fund of knowledge Supportive family/friends   PROBLEM LIST: Problem List/Patient Goals Date to be addressed Date deferred Reason deferred Estimated date of resolution  Substance Abuse 05/10/15     Mood Disorder 05/10/15     Suicidal  05/10/15     Depression 05/10/2015                                    DISCHARGE CRITERIA:  Ability to meet basic life and health needs Adequate post-discharge living arrangements Improved stabilization in mood, thinking, and/or behavior Safe-care adequate arrangements made  PRELIMINARY DISCHARGE PLAN: Outpatient therapy Return to previous living arrangement  PATIENT/FAMIILY INVOLVEMENT: This treatment plan has been presented to and reviewed with the patient, Jaime Shannon, and/or family member,  .  The patient and family have been given the opportunity to ask questions and make suggestions.  Jaime Shannon Jaime Shannon 05/10/2015, 4:41 PM

## 2015-05-10 NOTE — ED Notes (Signed)
ED BHU PLACEMENT JUSTIFICATION Is the patient under IVC or is there intent for IVC: Yes.   Is the patient medically cleared: Yes.   Is there vacancy in the ED BHU: Yes.   Is the population mix appropriate for patient: Yes.   Is the patient awaiting placement in inpatient or outpatient setting: inpt admission  Has the patient had a psychiatric consult: Yes.   Survey of unit performed for contraband, proper placement and condition of furniture, tampering with fixtures in bathroom, shower, and each patient room: Yes.  ; Findings:  APPEARANCE/BEHAVIOR Calm and cooperative NEURO ASSESSMENT Orientation: oriented x3  Denies pain Hallucinations: No.None noted (Hallucinations) Speech: Normal Gait: normal RESPIRATORY ASSESSMENT Even  Unlabored respirations  CARDIOVASCULAR ASSESSMENT Pulses equal   regular rate  Skin warm and dry   GASTROINTESTINAL ASSESSMENT no GI complaint EXTREMITIES Full ROM  PLAN OF CARE Provide calm/safe environment. Vital signs assessed twice daily. ED BHU Assessment once each 12-hour shift. Collaborate with intake RN daily or as condition indicates. Assure the ED provider has rounded once each shift. Provide and encourage hygiene. Provide redirection as needed. Assess for escalating behavior; address immediately and inform ED provider.  Assess family dynamic and appropriateness for visitation as needed: Yes.  ; If necessary, describe findings:  Educate the patient/family about BHU procedures/visitation: Yes.  ; If necessary, describe findings:

## 2015-05-10 NOTE — Plan of Care (Signed)
Problem: Alteration in mood Goal: LTG-Patient reports reduction in suicidal thoughts (Patient reports reduction in suicidal thoughts and is able to verbalize a safety plan for whenever patient is feeling suicidal)  Outcome: Progressing Denies  Suicidal ideations      

## 2015-05-11 DIAGNOSIS — F3160 Bipolar disorder, current episode mixed, unspecified: Secondary | ICD-10-CM

## 2015-05-11 MED ORDER — HYDROXYZINE HCL 50 MG PO TABS: 50.0000 mg | ORAL_TABLET | Freq: Three times a day (TID) | ORAL | Status: DC | PRN

## 2015-05-11 MED ORDER — DIVALPROEX SODIUM 500 MG PO DR TAB
500.0000 mg | DELAYED_RELEASE_TABLET | Freq: Three times a day (TID) | ORAL | Status: DC
  Filled 2015-05-11: qty 1

## 2015-05-11 MED ORDER — LAMOTRIGINE 25 MG PO TABS: 50.0000 mg | ORAL_TABLET | Freq: Every day | ORAL | Status: AC

## 2015-05-11 MED ORDER — HYDROXYZINE HCL 50 MG PO TABS
50.0000 mg | ORAL_TABLET | Freq: Three times a day (TID) | ORAL | Status: DC
  Administered 2015-05-11: 50 mg via ORAL
  Filled 2015-05-11: qty 1

## 2015-05-11 MED ORDER — LAMOTRIGINE 25 MG PO TABS: 50.0000 mg | ORAL_TABLET | Freq: Every day | ORAL | Status: DC

## 2015-05-11 MED ORDER — RISPERIDONE 2 MG PO TABS: 2.0000 mg | ORAL_TABLET | Freq: Two times a day (BID) | ORAL | Status: DC

## 2015-05-11 MED ORDER — RISPERIDONE 2 MG PO TABS: 2.0000 mg | ORAL_TABLET | Freq: Two times a day (BID) | ORAL | Status: AC

## 2015-05-11 MED ORDER — LAMOTRIGINE 100 MG PO TABS
50.0000 mg | ORAL_TABLET | Freq: Every day | ORAL | Status: DC
  Administered 2015-05-11: 50 mg via ORAL
  Filled 2015-05-11: qty 1

## 2015-05-11 MED ORDER — HYDROXYZINE HCL 50 MG PO TABS: 50.0000 mg | ORAL_TABLET | Freq: Three times a day (TID) | ORAL | Status: AC | PRN

## 2015-05-11 NOTE — Progress Notes (Signed)
D:Patient aware of discharge this shift . Patient returning home . Patient received all belonging locked up . Patient denies  Suicidal  And homicidal ideations  .  A: Writer instructed on discharge criteria  .Prescriptions  given topatient . Aware  Of follow up appointment . R: Patient left unit with no questions  Or concerns  With sister

## 2015-05-11 NOTE — H&P (Signed)
Psychiatric Admission Assessment Adult  Patient Identification: Jaime Shannon MRN:  [id number] Date of Evaluation:  05/11/2015 Chief Complaint:  Depression Principal Diagnosis: <principal problem not specified> Diagnosis:   Patient Active Problem List   Diagnosis Date Noted  . MDD (major depressive disorder), recurrent episode, severe ([place]) [F33.2] 05/10/2015  . Antisocial personality disorder [F60.2] 05/09/2015  . Tobacco use disorder [F17.200] 05/09/2015  . 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 Cannabis use disorder, severe, dependence ([place]) [F12.20] 04/26/2015   History of Present Illness:  IDENTYFYING DATA: Jaime Shannon is a 35 year old male with a history of anxiety, mood instability and substance abuse,  CHIEF COMPLAINT: "I need help. I couldn't afford my medications."  HISTORY OF PRESENT ILLNESS: Information was obtained from the patient and the chart. The patient was recently hospitalized at [facility] for 10 days for a manic episode. He was discharged on a combination of Lamictal, Risperdal, Cogentin, and Vistaril. Unfortunately his insurance company will not pay for Risperdal and the patient stopped taking it for a week. He continued on Lamictal and Vistaril. He became emotionally unstable again with heightened anxiety, mood swings, irritability, and insomnia. He reports heightened anxiety and has been taking Vistaril more than prescribed. He talked to his sister was suggested that he returns to the hospital. He has been smoking cannabis daily. He denies alcohol or other drug use. He denies psychotic symptoms or symptoms suggestive of frank mania.   PAST PSYCHIATRIC HISTORY: Patient has a long history of bipolar disorder. He was hospitalized at [facility] twice and at [facility] recently for similar symptoms. There were no suicide attempts. He has a history of treatment noncompliance. He has been tried on multiple medications  including Depakote, Tegretol, Paxil, Trazodon, Hydroxyzine, Risperdal, and Lamictal.   FAMILY PSYCHIATRIC HISTORY:  His father is an alcoholic and drug addict.  SOCIAL HISTORY: He works as a Biomedical scientist at [employer]. He is currently homeless and stays with friends or in his car. He has been married 3 times. His wife took a restraining order. He denies domestic violence. He has a 79-year-old daughter from a previous relationship who lives with her mother. He was incarcerated multiple times for robbery. There are no current legal problems  Total Time spent with patient: 1 hour  Past Psychiatric History: Bipolar disorder and substance use.  Risk to Self: Is patient at risk for suicide?: Yes Risk to Others:   Prior Inpatient Therapy:   Prior Outpatient Therapy:    Alcohol Screening: 1. How often do you have a drink containing alcohol?: Never 2. How many drinks containing alcohol do you have on a typical day when you are drinking?: 1 or 2 3. How often do you have six or more drinks on one occasion?: Never Preliminary Score: 0 4. How often during the last year have you found that you were not able to stop drinking once you had started?: Never 5. How often during the last year have you failed to do what was normally expected from you becasue of drinking?: Never 6. How often during the last year have you needed a first drink in the morning to get yourself going after a heavy drinking session?: Never 7. How often during the last year have you had a feeling of guilt of remorse after drinking?: Never 8. How often during the last year have you been unable to remember what happened the night before because you had been drinking?: Never 9. Have you or  someone else been injured as a result of your drinking?: No 10. Has a relative or friend or a doctor or another health worker been concerned about your drinking or suggested you cut down?: No Alcohol Use Disorder Identification Test Final Score (AUDIT):  0 Brief Intervention: AUDIT score less than 7 or less-screening does not suggest unhealthy drinking-brief intervention not indicated Substance Abuse History in the last 12 months:  Yes.   Consequences of Substance Abuse: Negative Previous Psychotropic Medications: Yes  Psychological Evaluations: No  Past Medical History:  Past Medical History  Diagnosis Date  . Kidney stone   . Bipolar 1 disorder [facility])     Past Surgical History  Procedure Laterality Date  . Abdominal surgery    . Surgery scrotal / testicular     Family History:  Family History  Problem Relation Age of Onset  . Bipolar disorder Father   . Alcoholism Father   . Bipolar disorder Other   . Heart Problems Maternal Grandfather    Family Psychiatric  History: Father with drug abuse. Tobacco Screening: @FLOW ([id number])::1)@ Social History:  History  Alcohol Use  . Yes     History  Drug Use  . Yes  . Special: Marijuana    Comment: every day for two weeks    Additional Social History:                           Allergies:   Allergies  Allergen Reactions  . Ambien [Zolpidem Tartrate]     hyperactive  . Trazodone And Nefazodone Other (See Comments)    tremor  . Promethazine Nausea Only   Lab Results:  Results for orders placed or performed during the hospital encounter of 05/09/15 (from the past 48 hour(s))  Comprehensive metabolic panel     Status: Abnormal   Collection Time: 05/09/15  1:44 PM  Result Value Ref Range   Sodium 140 135 - 145 mmol/L   Potassium 3.9 3.5 - 5.1 mmol/L   Chloride 106 101 - 111 mmol/L   CO2 28 22 - 32 mmol/L   Glucose, Bld 118 (H) 65 - 99 mg/dL   BUN 11 6 - 20 mg/dL   Creatinine, Ser 0.74 0.61 - 1.24 mg/dL   Calcium 9.8 8.9 - 10.3 mg/dL   Total Protein 7.7 6.5 - 8.1 g/dL   Albumin 4.4 3.5 - 5.0 g/dL   AST 20 15 - 41 U/L   ALT 40 17 - 63 U/L   Alkaline Phosphatase 63 38 - 126 U/L   Total Bilirubin 0.4 0.3 - 1.2 mg/dL   GFR calc non Af Amer >60 >60 mL/min    GFR calc Af Amer >60 >60 mL/min    Comment: (NOTE) The eGFR has been calculated using the CKD EPI equation. This calculation has not been validated in all clinical situations. eGFR's persistently <60 mL/min signify possible Chronic Kidney Disease.    Anion gap 6 5 - 15  Ethanol (ETOH)     Status: None   Collection Time: 05/09/15  1:44 PM  Result Value Ref Range   Alcohol, Ethyl (B) <5 <5 mg/dL    Comment:        LOWEST DETECTABLE LIMIT FOR SERUM ALCOHOL IS 5 mg/dL FOR MEDICAL PURPOSES ONLY   Salicylate level     Status: None   Collection Time: 05/09/15  1:44 PM  Result Value Ref Range   Salicylate Lvl <4.7 2.8 - 30.0 mg/dL  Acetaminophen level  Status: Abnormal   Collection Time: 05/09/15  1:44 PM  Result Value Ref Range   Acetaminophen (Tylenol), Serum <10 (L) 10 - 30 ug/mL    Comment:        THERAPEUTIC CONCENTRATIONS VARY SIGNIFICANTLY. A RANGE OF 10-30 ug/mL MAY BE AN EFFECTIVE CONCENTRATION FOR MANY PATIENTS. HOWEVER, SOME ARE BEST TREATED AT CONCENTRATIONS OUTSIDE THIS RANGE. ACETAMINOPHEN CONCENTRATIONS >150 ug/mL AT 4 HOURS AFTER INGESTION AND >50 ug/mL AT 12 HOURS AFTER INGESTION ARE OFTEN ASSOCIATED WITH TOXIC REACTIONS.   CBC     Status: Abnormal   Collection Time: 05/09/15  1:44 PM  Result Value Ref Range   WBC 11.0 (H) 3.8 - 10.6 K/uL   RBC 5.08 4.40 - 5.90 MIL/uL   Hemoglobin 14.7 13.0 - 18.0 g/dL   HCT 43.9 40.0 - 52.0 %   MCV 86.3 80.0 - 100.0 fL   MCH 29.0 26.0 - 34.0 pg   MCHC 33.6 32.0 - 36.0 g/dL   RDW 14.3 11.5 - 14.5 %   Platelets 374 150 - 440 K/uL  Urine Drug Screen, Qualitative ([facility] only)     Status: Abnormal   Collection Time: 05/09/15  1:48 PM  Result Value Ref Range   Tricyclic, Ur Screen NONE DETECTED NONE DETECTED   Amphetamines, Ur Screen NONE DETECTED NONE DETECTED   MDMA (Ecstasy)Ur Screen NONE DETECTED NONE DETECTED   Cocaine Metabolite,Ur [place] NONE DETECTED NONE DETECTED   Opiate, Ur Screen NONE DETECTED NONE DETECTED    Phencyclidine (PCP) Ur S NONE DETECTED NONE DETECTED   Cannabinoid 50 Ng, Ur [place] POSITIVE (A) NONE DETECTED   Barbiturates, Ur Screen NONE DETECTED NONE DETECTED   Benzodiazepine, Ur Scrn POSITIVE (A) NONE DETECTED   Methadone Scn, Ur NONE DETECTED NONE DETECTED    Comment: (NOTE) 409  Tricyclics, urine               Cutoff 1000 ng/mL 200  Amphetamines, urine             Cutoff 1000 ng/mL 300  MDMA (Ecstasy), urine           Cutoff 500 ng/mL 400  Cocaine Metabolite, urine       Cutoff 300 ng/mL 500  Opiate, urine                   Cutoff 300 ng/mL 600  Phencyclidine (PCP), urine      Cutoff 25 ng/mL 700  Cannabinoid, urine              Cutoff 50 ng/mL 800  Barbiturates, urine             Cutoff 200 ng/mL 900  Benzodiazepine, urine           Cutoff 200 ng/mL 1000 Methadone, urine                Cutoff 300 ng/mL 1100 1200 The urine drug screen provides only a preliminary, unconfirmed 1300 analytical test result and should not be used for non-medical 1400 purposes. Clinical consideration and professional judgment should 1500 be applied to any positive drug screen result due to possible 1600 interfering substances. A more specific alternate chemical method 1700 must be used in order to obtain a confirmed analytical result.  1800 Gas chromato graphy / mass spectrometry (GC/MS) is the preferred 1900 confirmatory method.     Metabolic Disorder Labs:  Lab Results  Component Value Date   HGBA1C 5.6 04/26/2015   MPG 114 04/26/2015   Lab Results  Component Value Date   PROLACTIN 20.6* 04/26/2015   Lab Results  Component Value Date   CHOL 229* 04/26/2015   TRIG 253* 04/26/2015   HDL 50 04/26/2015   CHOLHDL 4.6 04/26/2015   VLDL 51* 04/26/2015   LDLCALC 128* 04/26/2015   LDLCALC 169* 04/27/2011    Current Medications: Current Facility-Administered Medications  Medication Dose Route Frequency Provider Last Rate Last Dose  . acetaminophen (TYLENOL) tablet 650 mg  650 mg Oral  Q6H PRN Dewain Penning, MD      . alum & mag hydroxide-simeth (MAALOX/MYLANTA) 200-200-20 MG/5ML suspension 30 mL  30 mL Oral Q4H PRN Dewain Penning, MD      . chlordiazePOXIDE (LIBRIUM) capsule 25 mg  25 mg Oral QID Clovis Fredrickson, MD   25 mg at 05/11/15 0917  . diphenhydrAMINE (BENADRYL) capsule 50 mg  50 mg Oral QHS Clovis Fredrickson, MD   50 mg at 05/10/15 2214  . hydrOXYzine (ATARAX/VISTARIL) tablet 50 mg  50 mg Oral TID Clovis Fredrickson, MD      . lamoTRIgine (LAMICTAL) tablet 50 mg  50 mg Oral Daily Danford Tat B Jhovanny Guinta, MD      . magnesium hydroxide (MILK OF MAGNESIA) suspension 30 mL  30 mL Oral Daily PRN Dewain Penning, MD      . nicotine (NICODERM CQ - dosed in mg/24 hours) patch 21 mg  21 mg Transdermal Daily [facility] B Cosima Prentiss, MD   21 mg at 05/10/15 2218  . risperiDONE (RISPERDAL) tablet 2 mg  2 mg Oral BID Clovis Fredrickson, MD   2 mg at 05/11/15 2706   PTA Medications: Prescriptions prior to admission  Medication Sig Dispense Refill Last Dose  . benztropine (COGENTIN) 0.5 MG tablet Take 1 tablet (0.5 mg total) by mouth 2 (two) times daily. For prevention of EPS 60 tablet 0 05/09/2015 at Unknown time  . hydrOXYzine (ATARAX/VISTARIL) 50 MG tablet Take 1 tablet (50 mg total) by mouth 3 (three) times daily as needed for anxiety. 60 tablet 0 05/09/2015 at Unknown time  . lamoTRIgine (LAMICTAL) 25 MG tablet Take 1 tablet (25 mg total) by mouth daily. For mood stabilization 30 tablet 0 05/09/2015 at Unknown time  . nicotine polacrilex (NICORETTE) 2 MG gum Take 1 each (2 mg total) by mouth as needed for smoking cessation. 100 tablet 0 05/09/2015 at Unknown time  . polymixin-bacitracin (POLYSPORIN) 500-10000 UNIT/GM OINT ointment Apply 1 application topically 2 (two) times daily. For wound care 1 Tube 0 05/09/2015 at Unknown time  . QUEtiapine (SEROQUEL) 50 MG tablet Take 1 tablet (50 mg total) by mouth at bedtime as needed (sleep). /mood control 30 tablet 0 05/09/2015 at Unknown time   . risperiDONE (RISPERDAL M-TABS) 0.5 MG disintegrating tablet Take 1 tablet (0.5 mg total) by mouth 3 (three) times daily. For mood control/anxiety 90 tablet 0 05/09/2015 at Unknown time    Musculoskeletal: Strength & Muscle Tone: within normal limits Gait & Station: normal Patient leans: N/A  Psychiatric Specialty Exam: I reviewed physical exam performed in the emergency room and agree with the findings. Physical Exam  Nursing note and vitals reviewed.   Review of Systems  All other systems reviewed and are negative.   Blood pressure 125/83, pulse 87, temperature 98.3 F (36.8 C), temperature source Oral, resp. rate 18, height 5' 10"  (1.778 m), weight 86.637 kg (191 lb), SpO2 97 %.Body mass index is 27.41 kg/(m^2).  See SRA.  this is              Sleep:  Number of Hours: 6.25     Treatment Plan Summary: Daily contact with patient to assess and evaluate symptoms and progress in treatment and Medication management   Mr. Gonzaga is a 35 year old male with a history of bipolar disorder and substance use admitted for suicidal ideation in the context of medication noncompliance due to financial difficulties.  1. Suicidal ideation. The patient denies any thoughts intentions or plans to hurt himself or others. He is able to contract for safety.  2. Mood. He was restarted on a combination of Risperdal and Lamictal for mood stabilization.  3. Anxiety. He was restarted on hydroxyzine.  4. Substance abuse. The patient is a daily marijuana smoker. He minimizes his problems and declines substance abuse treatment.  5. Disposition. He will be discharged to his car. He will follow up with [facility].   Observation Level/Precautions:  15 minute checks  Laboratory:  CBC Chemistry Profile UDS UA  Psychotherapy:    Medications:    Consultations:    Discharge Concerns:    Estimated LOS:  Other:     I certify that inpatient services  furnished can reasonably be expected to improve the patient's condition.    Orson Slick, MD 2/8/201710:50 AM

## 2015-05-11 NOTE — Progress Notes (Signed)
  [facility] Adult Case Management Discharge Plan :  Will you be returning to the same living situation after discharge:  No,pt plans to live with friends in [place], [place] upon discharge At discharge, do you have transportation home?: Yes,  pt will be picked up by his sister Do you have the ability to pay for your medications: Yes,  pt will be provided with prescriptions at discharge  Release of information consent forms completed and in the chart;  Patient's signature needed at discharge.  Patient to Follow up at: Follow-up Information    Follow up with [facility].   Why:  Please arrive to the walk-in clinic between the hours of 8am-2:30pm for your assessment for meidcation management anbd therapy.  Arrive as early as possible for prompt service.    Contact information:   [address] [place], [place] [address] Phone: [phone] Fax: [phone]      Next level of care provider has access to [facility] Link:no  Safety Planning and Suicide Prevention discussed: No, pt refused CSW   Have you used any form of tobacco in the last 30 days? (Cigarettes, Smokeless Tobacco, Cigars, and/or Pipes): Yes  Has patient been referred to the Quitline?: Patient refused referral  Patient has been referred for addiction treatment: Yes  Dorothe Pea Castulo Scarpelli 05/11/2015, 1:20 PM

## 2015-05-11 NOTE — BHH Counselor (Deleted)
Adult Comprehensive Assessment  Patient ID: TREVIS EDEN, male   DOB: Aug 20, 1980, 35 y.o.   MRN: [id number]  Information Source:    Current Stressors:     Living/Environment/Situation:  Living Arrangements: Non-relatives/Friends  Family History:     Childhood History:     Education:     Employment/Work Situation:      Surveyor, quantity Resources:      Alcohol/Substance Abuse:      Social Support System:      Leisure/Recreation:      Strengths/Needs:      Discharge Plan:      Summary/Recommendations:   Summary and Recommendations (to be completed by the evaluator): Patient presented to the hospital with his sister and was admitted for anxiety and depression.  Pt's primary diagnosis is bipolar disorder, current episode mixed, severe, with psychotic features (HCC).  Pt reports primary triggers for admission was a court date the day of admission where the pt's wife declared a separation was necessary.  Pt reports his stressors are only seeing his daughter once a week and issues in he and his wife's relationship.  Pt now denies SI/HI/AVH.  Patient lives in [place], [place].  Pt lists supports in the community as hisfellow employees, his boss, and his sister.  Patient will benefit from crisis stabilization, medication evaluation, group therapy, and psycho education in addition to case management for discharge planning. Patient and CSW reviewed pt's identified goals and treatment plan. Pt verbalized understanding and agreed to treatment plan.  At discharge it is recommended that patient remain compliant with established plan and continue treatment.  Dorothe Pea Sahira Cataldi. 05/11/2015

## 2015-05-11 NOTE — [facility] Suicide Risk Assessment (Signed)
[facility] Discharge Suicide Risk Assessment   Principal Problem: <principal problem not specified> Discharge Diagnoses:  Patient Active Problem List   Diagnosis Date Noted  . MDD (major depressive disorder), recurrent episode, severe (HCC) [F33.2] 05/10/2015  . Antisocial personality disorder [F60.2] 05/09/2015  . Tobacco use disorder [F17.200] 05/09/2015  . Sedative, hypnotic or anxiolytic use disorder, severe, dependence (HCC) [F13.20] 05/09/2015  . Cannabis use disorder, severe, dependence (HCC) [F12.20] 04/26/2015    Total Time spent with patient: 1 hour  Musculoskeletal: Strength & Muscle Tone: within normal limits Gait & Station: normal Patient leans: N/A  Psychiatric Specialty Exam: Review of Systems  All other systems reviewed and are negative.   Blood pressure 125/83, pulse 87, temperature 98.3 F (36.8 C), temperature source Oral, resp. rate 18, height  (1.778 m), weight 86.637 kg (191 lb), SpO2 97 %.Body mass index is 27.41 kg/(m^2).  See SRA.                                                     Mental Status Per Nursing Assessment::   On Admission:     Demographic Factors:  Male, Divorced or widowed, Caucasian and Low socioeconomic status  Loss Factors: Loss of significant relationship and Financial problems/change in socioeconomic status  Historical Factors: Family history of mental illness or substance abuse and Impulsivity  Risk Reduction Factors:   Responsible for children under 39 years of age, Sense of responsibility to family, Employed and Positive social support  Continued Clinical Symptoms:  Bipolar Disorder:   Mixed State Alcohol/Substance Abuse/Dependencies  Cognitive Features That Contribute To Risk:  None    Suicide Risk:  Minimal: No identifiable suicidal ideation.  Patients presenting with no risk factors but with morbid ruminations; may be classified as minimal risk based on the severity of the depressive  symptoms  Follow-up Information    Follow up with [employer].      Plan Of Care/Follow-up recommendations:  Activity:  As tolerated. Diet:  Low sodium heart healthy. Other:  Keep follow-up appointments.  Kristine Linea, MD 05/11/2015, 11:15 AM

## 2015-05-11 NOTE — B[facility] Suicide Risk Assessment (Signed)
[facility] INPATIENT:  Family/Significant Other Suicide Prevention Education  Suicide Prevention Education:  Patient Refusal for Family/Significant Other Suicide Prevention Education: The patient Jaime Shannon has refused to provide written consent for family/significant other to be provided Family/Significant Other Suicide Prevention Education during admission and/or prior to discharge.  Physician notified.  Pt refused SPE with CSW  Mercy Riding 05/11/2015, 11:26 AM

## 2015-05-11 NOTE — Progress Notes (Signed)
D: Patient complains of anxiety. He has been seen pacing through the halls. He denies SI/HI/AVH. He stated, "I only said I was suicidal so I could get in here. That's what you have to say." Patient denies any pain.  A: Medication was given with education. Encouragement was provided.  R: Patient was compliant with medication. He has remained calm and cooperative. Safety maintained with 15 min checks.

## 2015-05-11 NOTE — [facility] Suicide Risk Assessment (Signed)
[facility] Admission Suicide Risk Assessment   Nursing information obtained from:    Demographic factors:    Current Mental Status:    Loss Factors:    Historical Factors:    Risk Reduction Factors:     Total Time spent with patient: 1 hour Principal Problem: <principal problem not specified> Diagnosis:   Patient Active Problem List   Diagnosis Date Noted  . MDD (major depressive disorder), recurrent episode, severe (HCC) [F33.2] 05/10/2015  . Antisocial personality disorder [F60.2] 05/09/2015  . Tobacco use disorder [F17.200] 05/09/2015  . Sedative, hypnotic or anxiolytic use disorder, severe, dependence (HCC) [F13.20] 05/09/2015  . Cannabis use disorder, severe, dependence (HCC) [F12.20] 04/26/2015   Subjective Data: Mood instability.  Continued Clinical Symptoms:  Alcohol Use Disorder Identification Test Final Score (AUDIT): 0 The "Alcohol Use Disorders Identification Test", Guidelines for Use in Primary Care, Second Edition.  World Science writer [facility]). Score between 0-7:  no or low risk or alcohol related problems. Score between 8-15:  moderate risk of alcohol related problems. Score between 16-19:  high risk of alcohol related problems. Score 20 or above:  warrants further diagnostic evaluation for alcohol dependence and treatment.   CLINICAL FACTORS:   Bipolar Disorder:   Mixed State Alcohol/Substance Abuse/Dependencies   Musculoskeletal: Strength & Muscle Tone: within normal limits Gait & Station: normal Patient leans: N/A  Psychiatric Specialty Exam: Review of Systems  All other systems reviewed and are negative.   Blood pressure 125/83, pulse 87, temperature 98.3 F (36.8 C), temperature source Oral, resp. rate 18, height  (1.778 m), weight 86.637 kg (191 lb), SpO2 97 %.Body mass index is 27.41 kg/(m^2).  General Appearance: Casual  Eye Contact::  Good  Speech:  Clear and Coherent  Volume:  Normal  Mood:  Euthymic  Affect:  Appropriate  Thought  Process:  Goal Directed  Orientation:  Full (Time, Place, and Person)  Thought Content:  WDL  Suicidal Thoughts:  No  Homicidal Thoughts:  No  Memory:  Immediate;   Fair Recent;   Fair Remote;   Fair  Judgement:  Impaired  Insight:  Present  Psychomotor Activity:  Normal  Concentration:  Fair  Recall:  [employer] of Knowledge:Fair  Language: Fair  Akathisia:  No  Handed:  Right  AIMS (if indicated):     Assets:  Communication Skills Desire for Improvement Financial Resources/Insurance Physical Health Resilience Social Support  Sleep:  Number of Hours: 6.25  Cognition: WNL  ADL's:  Intact    COGNITIVE FEATURES THAT CONTRIBUTE TO RISK:  None    SUICIDE RISK:   Minimal: No identifiable suicidal ideation.  Patients presenting with no risk factors but with morbid ruminations; may be classified as minimal risk based on the severity of the depressive symptoms  PLAN OF CARE: Hospital admission, medication management, substance abuse counseling, discharge planning.  Mr. Trautman is a 35 year old male with a history of bipolar disorder and substance use admitted for suicidal ideation in the context of medication noncompliance due to financial difficulties.  1. Suicidal ideation. The patient denies any thoughts intentions or plans to hurt himself or others. He is able to contract for safety.  2. Mood. He was restarted on a combination of Risperdal and Lamictal for mood stabilization.  3. Anxiety. He was restarted on hydroxyzine.  4. Substance abuse. The patient is a daily marijuana smoker. He minimizes his problems and declines substance abuse treatment.  5. Disposition. He will be discharged to his car. He will follow up  with [facility].  I certify that inpatient services furnished can reasonably be expected to improve the patient's condition.   Kristine Linea, MD 05/11/2015, 10:46 AM

## 2015-05-11 NOTE — Plan of Care (Signed)
Problem: Diagnosis: Increased Risk For Suicide Attempt Goal: LTG-Patient Will Report Improved Mood and Deny Suicidal LTG (by discharge) Patient will report improved mood and deny suicidal ideation.  Outcome: Progressing Patient denies SI at this time.      
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
## 2015-05-11 NOTE — Tx Team (Signed)
Interdisciplinary Treatment Plan Update (Adult)        Date: 05/11/2015   Time Reviewed: 9:30 AM   Progress in Treatment: Improving  Attending groups: Yes  Participating in groups: Yes Taking medication as prescribed: Yes  Tolerating medication: Yes  Family/Significant other contact made: Yes, CSW spoke with the pt's sister Patient understands diagnosis: Yes  Discussing patient identified problems/goals with staff: Yes  Medical problems stabilized or resolved: Yes  Denies suicidal/homicidal ideation: Yes  Issues/concerns per patient self-inventory: Yes  Other:   New problem(s) identified: N/A   Discharge Plan or Barriers: Pt plans to return to [place] to live with friends and follow up with [facility] for medication management and therapy upon discharge  Reason for Continuation of Hospitalization:   Depression   Anxiety   Medication Stabilization   Comments: N/A   Estimated length of stay: 3-5 days    .   Patient is a 35 year old male admitted for The patient was recently hospitalized at [facility] for 10 days for a manic episode. He was discharged on a combination of Lamictal, Risperdal, Cogentin, and Vistaril. Unfortunately his insurance company will not pay for Risperdal and the patient stopped taking it for a week. He continued on Lamictal and Vistaril. He became emotionally unstable again with heightened anxiety, mood swings, irritability, and insomnia. He reports heightened anxiety and has been taking Vistaril more than prescribed. He talked to his sister was suggested that he returns to the hospital. He has been smoking cannabis daily. He denies alcohol or other drug use. He denies psychotic symptoms or symptoms suggestive of frank mania. Patient has a long history of bipolar disorder. He was hospitalized at [facility] twice and at [facility] recently for similar symptoms. There were no suicide attempts. He has a history of treatment noncompliance. He has  been tried on multiple medications including Depakote, Tegretol, Paxil, Trazodon, Hydroxyzine, Risperdal, and Lamictal.  Pt's father is an alcoholic and drug addict. Pt works as a Biomedical scientist at [employer]. He is currently homeless and stays with friends or in his car. He has been married 3 times. His wife took a restraining order. He denies domestic violence. He has a 56-year-old daughter from a previous relationship who lives with her mother. He was incarcerated multiple times for robbery. There are no current legal problems.  Patient lives in [place], [place]. Patient will benefit from crisis stabilization, medication evaluation, group therapy, and psycho education in addition to case management for discharge planning. Patient and CSW reviewed pt's identified goals and treatment plan. Pt verbalized understanding and agreed to treatment plan.   Review of initial/current patient goals per problem list:  1. Goal(s): Patient will participate in aftercare plan   Met: Yes  Target date: 3-5 days post admission date   As evidenced by: Patient will participate within aftercare plan AEB aftercare provider and housing plan at discharge being identified.   2/8: 2/8: Pt plans to return to [facility] to live with friends and follow up with [facility] for medication management and therapy upon discharge     2. Goal (s): Patient will exhibit decreased depressive symptoms and suicidal ideations.   Met: Adequate for discharge per MD.  Target date: 3-5 days post admission date   As evidenced by: Patient will utilize self-rating of depression at 3 or below and demonstrate decreased signs of depression or be deemed stable for discharge by MD.   2/8: Adequate for discharge per MD.  Pt denies  SI/HI.  Pt reports he is safe for discharge.    3. Goal(s): Patient will demonstrate decreased signs and symptoms of anxiety.   Met: Adequate for discharge per MD.  Target date: 3-5 days post  admission date   As evidenced by: Patient will utilize self-rating of anxiety at 3 or below and demonstrated decreased signs of anxiety, or be deemed stable for discharge by MD   2/8: Adequate for discharge per MD.  Pt reports baseline symptoms of anxiety      4. Goal(s): Patient will demonstrate decreased signs of withdrawal due to substance abuse   Met: Yes  Target date: 3-5 days post admission date   As evidenced by: Patient will produce a CIWA/COWS score of 0, have stable vitals signs, and no symptoms of withdrawal   2/8: Patient produced a CIWA/COWS score of 0, has stable vitals signs, and no symptoms of withdrawal     5. Goal (s): Patient will demonstrate decreased signs of mania  * Met: Adequate for discharge per MD. * Target date: 3-5 days post admission date  * As evidenced by: Patient demonstrate decreased signs of mania AEB decreased mood instability and demonstration of stable mood   2/8: Adequate for discharge per MD.    Attendees: Physician: Orson Slick, MD 2/8/201710:20 AM  Nursing: Elige Radon, RN 2/8/201710:20 AM  Other: Carmell Austria, [place] 2/8/201710:20 AM  Nursing: Polly Cobia, RN 2/8/201710:20 AM  Nursing: Carolynn Sayers 2/8/201710:20 AM  Other: 2/8/201710:20 AM  Other:  2/1224825:00 AM  Other:  2/8/201710:20 AM  Other:  2/8/201710:20 AM  Other:  2/8/201710:20 AM  Other:  2/8/201710:20 AM  Other:  2/8/201710:20 AM

## 2015-06-24 ENCOUNTER — Emergency Department
Admission: EM | Admit: 2015-06-24 | Discharge: 2015-06-24 | Disposition: A | Discharge status: Home / Self Care | Payer: 59 | Attending: Emergency Medicine | Admitting: Emergency Medicine

## 2015-06-24 ENCOUNTER — Encounter: Payer: Self-pay | Admitting: Emergency Medicine

## 2015-06-24 DIAGNOSIS — S61211A Laceration without foreign body of left index finger without damage to nail, initial encounter: Secondary | ICD-10-CM | POA: Insufficient documentation

## 2015-06-24 DIAGNOSIS — Y93G1 Activity, food preparation and clean up: Secondary | ICD-10-CM | POA: Diagnosis not present

## 2015-06-24 DIAGNOSIS — Y998 Other external cause status: Secondary | ICD-10-CM | POA: Diagnosis not present

## 2015-06-24 DIAGNOSIS — F17200 Nicotine dependence, unspecified, uncomplicated: Secondary | ICD-10-CM | POA: Insufficient documentation

## 2015-06-24 DIAGNOSIS — W268XXA Contact with other sharp object(s), not elsewhere classified, initial encounter: Secondary | ICD-10-CM | POA: Insufficient documentation

## 2015-06-24 DIAGNOSIS — Y9289 Other specified places as the place of occurrence of the external cause: Secondary | ICD-10-CM | POA: Diagnosis not present

## 2015-06-24 NOTE — ED Notes (Signed)
Making breakfast and cut left pointer finger.  Bleeding controlled.

## 2015-06-24 NOTE — ED Notes (Signed)
Called for room at 1145 and 1150  No answer

## 2017-08-24 IMAGING — CT CT ABD-PELV W/ CM
1 of 2 series · 15 of 32 positions shown, 19 images · IV contrast (omnipaque)
Comparison: CT scan of October 13, 2014.

CLINICAL DATA: Acute generalized abdominal pain.

EXAM:
CT ABDOMEN AND PELVIS WITH CONTRAST
TECHNIQUE: Multidetector CT imaging of the abdomen and pelvis was performed
using the standard protocol following bolus administration of
intravenous contrast.
CONTRAST:  100mL OMNIPAQUE IOHEXOL 300 MG/ML  SOLN

[Series 2: routine abd pel with · axial · 0.73mm/px · z∈[-1317,-817]mm · 15 of 110 slices shown, 19 images]
[im 5/110  soft-tissue]
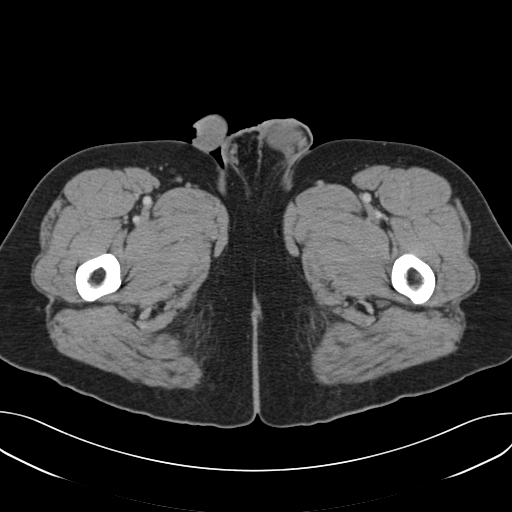
[im 5/110  bone]
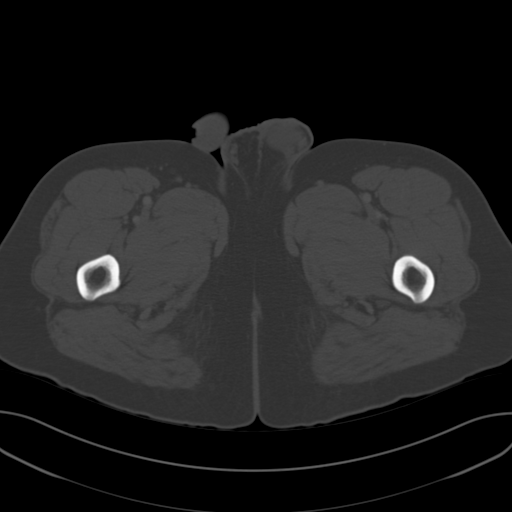
[im 14/110  soft-tissue]
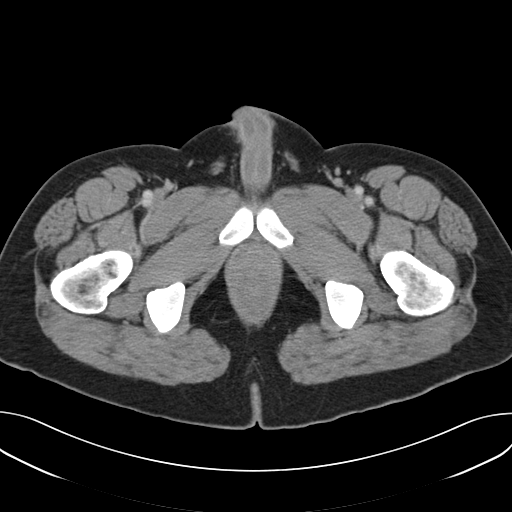
[im 22/110  soft-tissue]
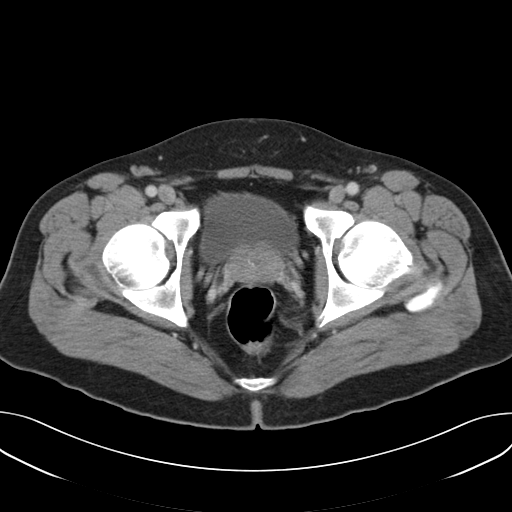
[im 31/110  soft-tissue]
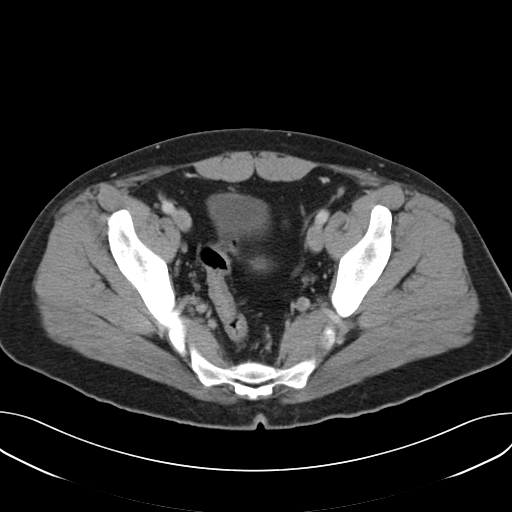
[im 40/110  soft-tissue]
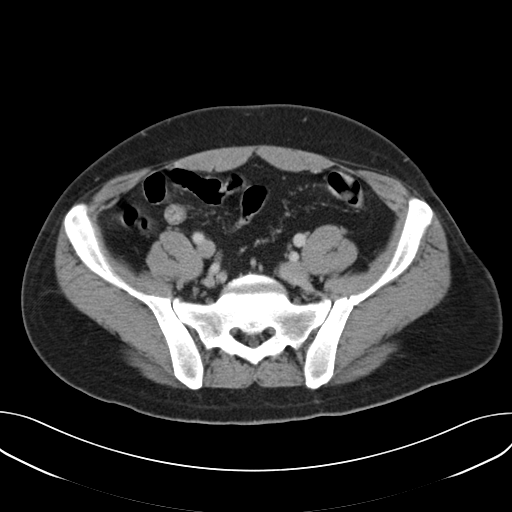
[im 48/110  soft-tissue]
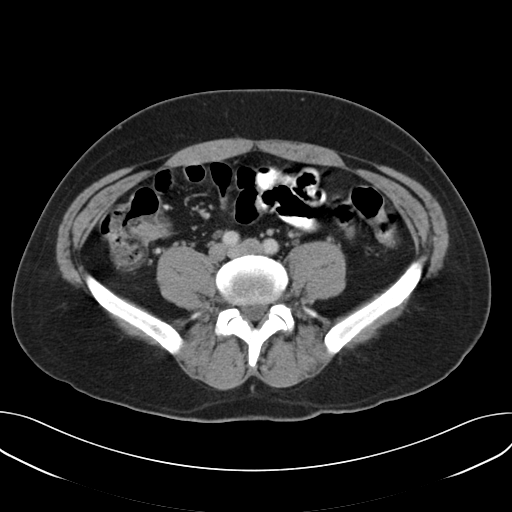
[im 57/110  soft-tissue]
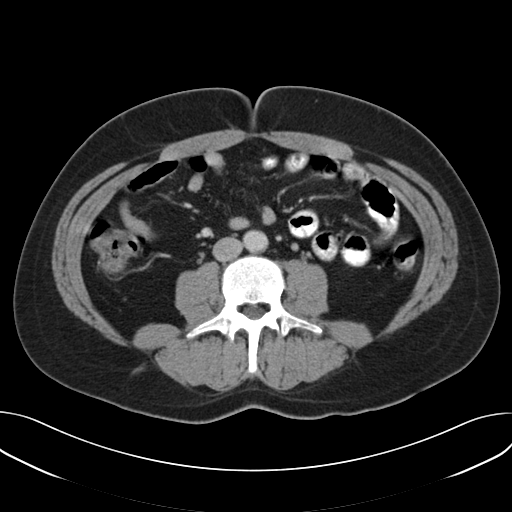
[im 62/110  soft-tissue]
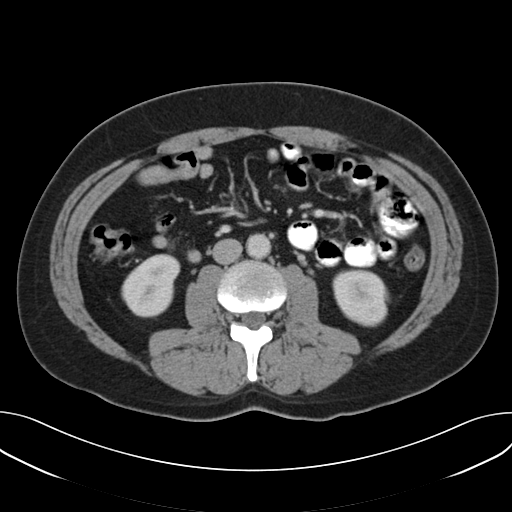
[im 70/110  soft-tissue]
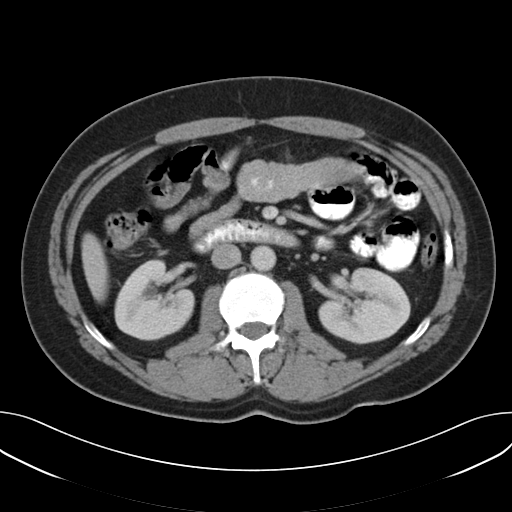
[im 70/110  bone]
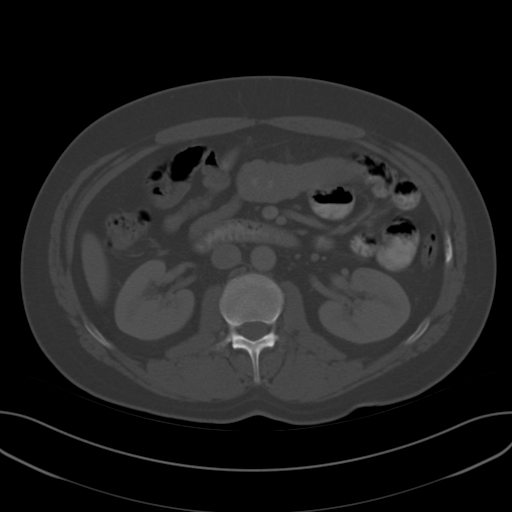
[im 79/110  soft-tissue]
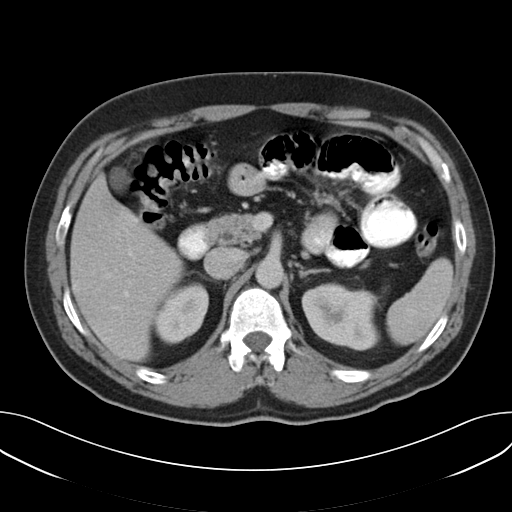
[im 88/110  soft-tissue]
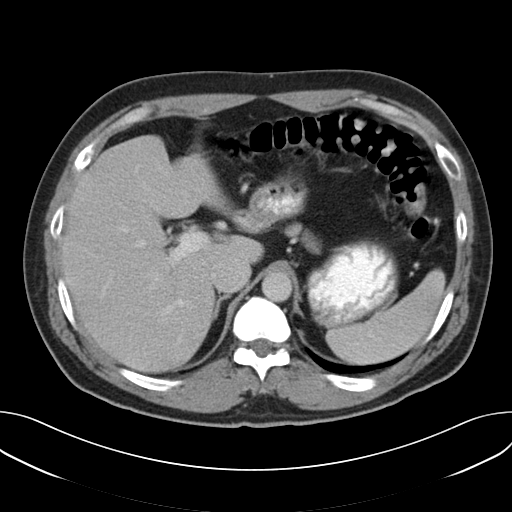
[im 92/110  lung]
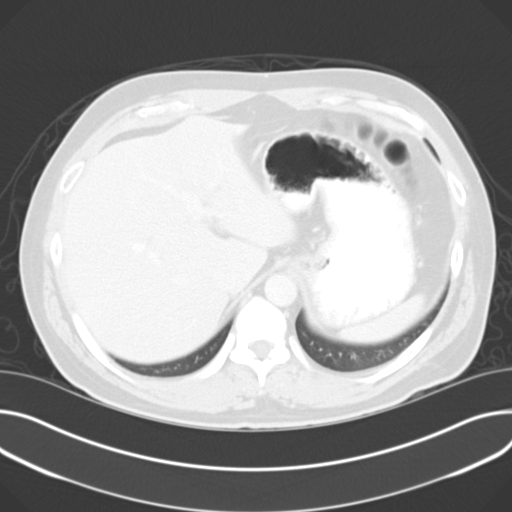
[im 96/110  soft-tissue]
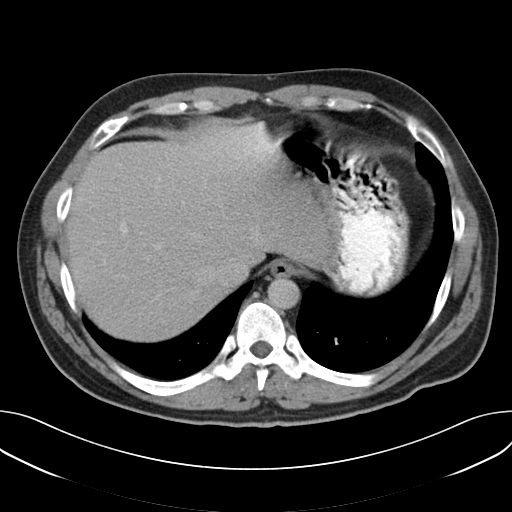
[im 96/110  lung]
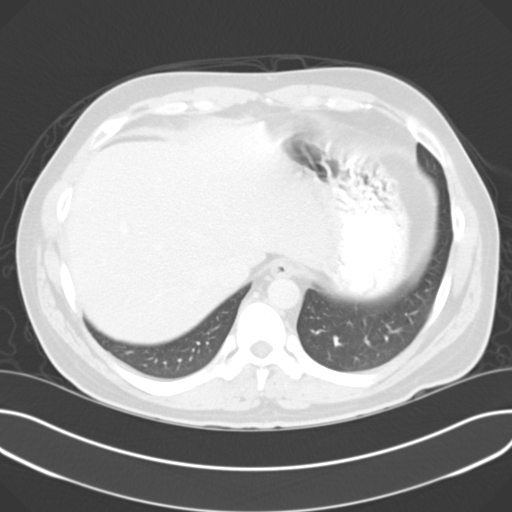
[im 101/110  lung]
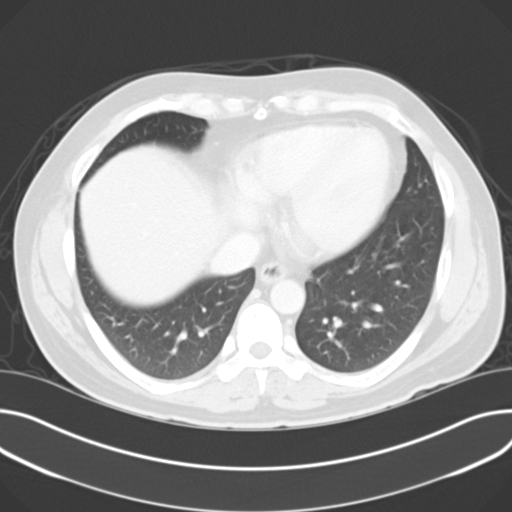
[im 105/110  soft-tissue]
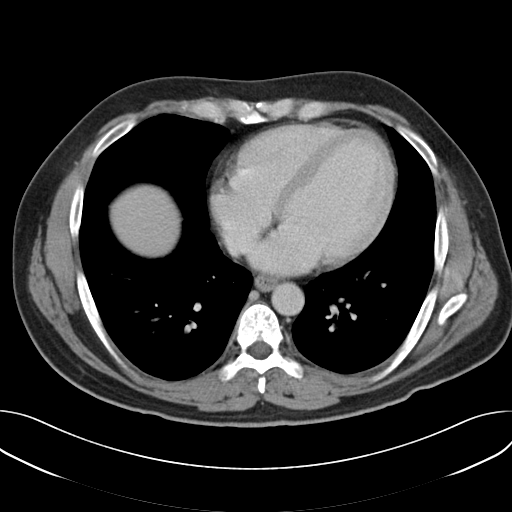
[im 105/110  lung]
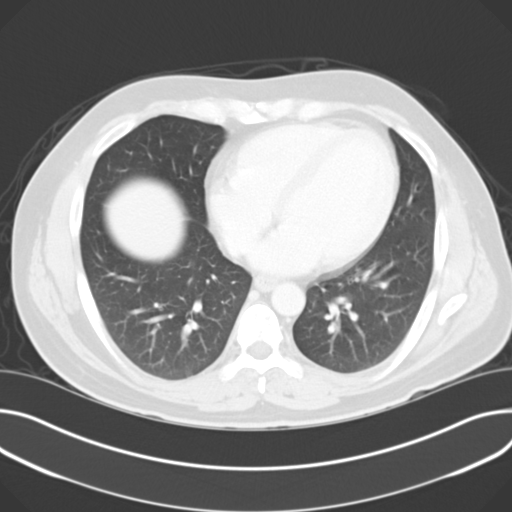

[15 of 32 positions shown; findings below may reference images not displayed]

FINDINGS: Visualized lung bases are unremarkable. Bony calvarium appears
intact.

No gallstones are noted. The liver, spleen and pancreas appear
normal. Adrenal glands and kidneys appear normal. No hydronephrosis
or renal obstruction is noted. No renal or ureteral calculi are
noted. The appendix appears normal. There is no evidence of bowel
obstruction. No abnormal fluid collection is noted. Urinary bladder
appears normal. Mild prostatic hypertrophy is noted with associated
calcifications. No significant adenopathy is noted.
IMPRESSION: Mild prostatic hypertrophy with associated calcifications. No other
significant abnormality seen in the abdomen.
